# Patient Record
Sex: Female | Born: 1965 | Race: White | Hispanic: No | Marital: Married | Smoking: Never smoker
Health system: Southern US, Community
[De-identification: ages and names within clinical notes are randomized; demographics above are authoritative.]

## PROBLEM LIST (undated history)

## (undated) DIAGNOSIS — T7840XA Allergy, unspecified, initial encounter: Secondary | ICD-10-CM

## (undated) DIAGNOSIS — M1990 Unspecified osteoarthritis, unspecified site: Secondary | ICD-10-CM

## (undated) DIAGNOSIS — E785 Hyperlipidemia, unspecified: Secondary | ICD-10-CM

## (undated) DIAGNOSIS — I10 Essential (primary) hypertension: Secondary | ICD-10-CM

## (undated) DIAGNOSIS — D649 Anemia, unspecified: Secondary | ICD-10-CM

## (undated) DIAGNOSIS — K5900 Constipation, unspecified: Secondary | ICD-10-CM

## (undated) DIAGNOSIS — F419 Anxiety disorder, unspecified: Secondary | ICD-10-CM

## (undated) DIAGNOSIS — K219 Gastro-esophageal reflux disease without esophagitis: Secondary | ICD-10-CM

## (undated) HISTORY — DX: Gastro-esophageal reflux disease without esophagitis: K21.9

## (undated) HISTORY — DX: Constipation, unspecified: K59.00

## (undated) HISTORY — DX: Essential (primary) hypertension: I10

## (undated) HISTORY — DX: Anemia, unspecified: D64.9

## (undated) HISTORY — DX: Unspecified osteoarthritis, unspecified site: M19.90

## (undated) HISTORY — DX: Hyperlipidemia, unspecified: E78.5

## (undated) HISTORY — DX: Allergy, unspecified, initial encounter: T78.40XA

## (undated) HISTORY — DX: Anxiety disorder, unspecified: F41.9

## (undated) HISTORY — PX: BREAST SURGERY: SHX581

## (undated) HISTORY — PX: WISDOM TOOTH EXTRACTION: SHX21

---

## 2002-04-22 ENCOUNTER — Ambulatory Visit (HOSPITAL_BASED_OUTPATIENT_CLINIC_OR_DEPARTMENT_OTHER): Admission: RE | Admit: 2002-04-22 | Discharge: 2002-04-22 | Payer: Self-pay | Admitting: *Deleted

## 2002-04-22 ENCOUNTER — Encounter: Admission: RE | Admit: 2002-04-22 | Discharge: 2002-04-22 | Payer: Self-pay | Admitting: *Deleted

## 2011-02-18 ENCOUNTER — Ambulatory Visit: Payer: BC Managed Care – PPO

## 2011-02-18 ENCOUNTER — Ambulatory Visit (INDEPENDENT_AMBULATORY_CARE_PROVIDER_SITE_OTHER): Payer: BC Managed Care – PPO | Admitting: Emergency Medicine

## 2011-02-18 VITALS — BP 156/83 | HR 73 | Temp 97.5°F | Resp 16 | Ht 66.25 in | Wt 174.8 lb

## 2011-02-18 DIAGNOSIS — R05 Cough: Secondary | ICD-10-CM

## 2011-02-18 DIAGNOSIS — R059 Cough, unspecified: Secondary | ICD-10-CM

## 2011-02-18 DIAGNOSIS — J159 Unspecified bacterial pneumonia: Secondary | ICD-10-CM

## 2011-02-18 DIAGNOSIS — J40 Bronchitis, not specified as acute or chronic: Secondary | ICD-10-CM

## 2011-02-18 DIAGNOSIS — N912 Amenorrhea, unspecified: Secondary | ICD-10-CM

## 2011-02-18 LAB — POCT URINE PREGNANCY: Preg Test, Ur: NEGATIVE

## 2011-02-18 MED ORDER — FLUTICASONE PROPIONATE 50 MCG/ACT NA SUSP: 2.0000 | Freq: Every day | NASAL | Status: DC

## 2011-02-18 MED ORDER — BENZONATATE 200 MG PO CAPS: 200.0000 mg | ORAL_CAPSULE | Freq: Two times a day (BID) | ORAL | Status: AC | PRN

## 2011-02-18 MED ORDER — AMOXICILLIN-POT CLAVULANATE 875-125 MG PO TABS: 1.0000 | ORAL_TABLET | Freq: Two times a day (BID) | ORAL | Status: AC

## 2011-02-18 NOTE — Progress Notes (Deleted)
  Subjective:    Patient ID: Megan Gallagher, female    DOB: 08/01/1965, 46 y.o.   MRN: [id number]  HPI    Review of Systems     Objective:   Physical Exam        Assessment & Plan:

## 2011-02-18 NOTE — Progress Notes (Signed)
CXR impression (per Dr. Cleta Alberts) Minimal increased basilar markings

## 2011-04-16 ENCOUNTER — Other Ambulatory Visit: Payer: Self-pay | Admitting: Emergency Medicine

## 2011-08-06 ENCOUNTER — Other Ambulatory Visit: Payer: Self-pay | Admitting: Physician Assistant

## 2011-08-07 ENCOUNTER — Other Ambulatory Visit: Payer: Self-pay | Admitting: *Deleted

## 2011-09-11 ENCOUNTER — Other Ambulatory Visit: Payer: Self-pay | Admitting: Emergency Medicine

## 2011-09-14 ENCOUNTER — Telehealth: Payer: Self-pay

## 2011-09-14 NOTE — Telephone Encounter (Signed)
The patient called regarding refill of her Lipitor rx.  The patient stated her rx was denied at the pharmacy and she would like a return call at [phone] to discuss why this happened.

## 2011-09-14 NOTE — Telephone Encounter (Signed)
Patient is due for office visit. I called her left message for her to call back so I can determine her follow up plan.

## 2011-09-16 ENCOUNTER — Ambulatory Visit (INDEPENDENT_AMBULATORY_CARE_PROVIDER_SITE_OTHER): Payer: BC Managed Care – PPO | Admitting: Emergency Medicine

## 2011-09-16 VITALS — BP 137/83 | HR 61 | Temp 98.3°F | Resp 16 | Ht 66.78 in | Wt 177.2 lb

## 2011-09-16 DIAGNOSIS — E782 Mixed hyperlipidemia: Secondary | ICD-10-CM

## 2011-09-16 LAB — COMPREHENSIVE METABOLIC PANEL
ALT: 20 U/L (ref 0–35)
AST: 15 U/L (ref 0–37)
Alkaline Phosphatase: 89 U/L (ref 39–117)
BUN: 9 mg/dL (ref 6–23)
Calcium: 9.5 mg/dL (ref 8.4–10.5)
Chloride: 106 mEq/L (ref 96–112)
Creat: 0.61 mg/dL (ref 0.50–1.10)
Total Bilirubin: 0.6 mg/dL (ref 0.3–1.2)

## 2011-09-16 LAB — LIPID PANEL
HDL: 48 mg/dL (ref >39)
Total CHOL/HDL Ratio: 4.3 Ratio
VLDL: 22 mg/dL (ref 0–40)

## 2011-09-16 NOTE — Telephone Encounter (Signed)
Pt will RTC today 

## 2011-09-16 NOTE — Progress Notes (Addendum)
   Date:  09/16/2011   Name:  Megan Gallagher   DOB:  1965-04-07   MRN:  [id number] Gender: female Age: 46 y.o.  PCP:  Lucilla Edin, MD    Chief Complaint: Hyperlipidemia   History of Present Illness:  Megan Gallagher is a 46 y.o. pleasant patient who presents with the following:  Has been on lipitor 40 since September.  has been out of meds for four days and has come in for repeat lab work.  Was instructed to follow up in November but failed to do so.  Denies any problems with meds.  Tolerating well without side effects.  No other complaints currently.  There is no problem list on file for this patient.   No past medical history on file.  No past surgical history on file.  History  Substance Use Topics  . Smoking status: Never Smoker   . Smokeless tobacco: Not on file  . Alcohol Use: Not on file    No family history on file.  No Known Allergies  Medication list has been reviewed and updated.  Outpatient Prescriptions Prior to Visit  Medication Sig Dispense Refill  . amLODipine (NORVASC) 10 MG tablet Take 1 tablet (10 mg total) by mouth daily. NEEDS OFFICE VISIT FOR MORE  30 tablet  0  . atorvastatin (LIPITOR) 40 MG tablet TAKE 1 TABLET BY MOUTH DAILY  30 tablet  0  . atorvastatin (LIPITOR) 10 MG tablet Take 10 mg by mouth daily.      . fluticasone (FLONASE) 50 MCG/ACT nasal spray Place 2 sprays into the nose daily.  16 g  6    Review of Systems:  As per HPI, otherwise negative.    Physical Examination: Filed Vitals:   09/16/11 1101  BP: 137/83  Pulse: 61  Temp: 98.3 F (36.8 C)  Resp: 16   Filed Vitals:   09/16/11 1101  Height: 5' 6.78" (1.696 m)  Weight: 177 lb 3.2 oz (80.377 kg)   Body mass index is 27.94 kg/(m^2). Ideal Body Weight: Weight in (lb) to have BMI = 25: 158.2    GEN: WDWN, NAD, Non-toxic, Alert & Oriented x 3 HEENT: Atraumatic, Normocephalic.  Ears and Nose: No external deformity. EXTR: No clubbing/cyanosis/edema NEURO: Normal  gait.  PSYCH: Normally interactive. Conversant. Not depressed or anxious appearing.  Calm demeanor.    Assessment and Plan: Elevated lipids Labs Refill meds. Follow up in 3 months for recheck on labs  Carmelina Dane, MD I have reviewed and agree with documentation. Robert P. Merla Riches, M.D.

## 2011-09-19 MED ORDER — ROSUVASTATIN CALCIUM 20 MG PO TABS: 20.0000 mg | ORAL_TABLET | Freq: Every day | ORAL | Status: DC

## 2011-09-21 ENCOUNTER — Encounter: Payer: Self-pay | Admitting: Radiology

## 2011-11-11 ENCOUNTER — Telehealth: Payer: Self-pay

## 2011-11-11 NOTE — Telephone Encounter (Signed)
[facility] answering service called stated received call from [facility] pt request prescription be changed from 30 days to 90 days so  Ins. Would pay.  [facility] phone # [phone] pt seen by S. Weber

## 2011-11-12 ENCOUNTER — Telehealth: Payer: Self-pay

## 2011-11-12 MED ORDER — AMLODIPINE BESYLATE 10 MG PO TABS: 10.0000 mg | ORAL_TABLET | Freq: Every day | ORAL | Status: DC

## 2011-11-12 NOTE — Telephone Encounter (Signed)
rx sent in 

## 2011-11-12 NOTE — Telephone Encounter (Signed)
Megan Gallagher from [facility] [address] called for pt needing prescription refill on Norvasc.

## 2012-01-02 ENCOUNTER — Ambulatory Visit (INDEPENDENT_AMBULATORY_CARE_PROVIDER_SITE_OTHER): Payer: BC Managed Care – PPO | Admitting: Family Medicine

## 2012-01-02 ENCOUNTER — Ambulatory Visit: Payer: BC Managed Care – PPO

## 2012-01-02 VITALS — BP 143/83 | HR 91 | Temp 97.6°F | Resp 16 | Ht 67.0 in | Wt 179.0 lb

## 2012-01-02 DIAGNOSIS — R05 Cough: Secondary | ICD-10-CM

## 2012-01-02 DIAGNOSIS — I10 Essential (primary) hypertension: Secondary | ICD-10-CM

## 2012-01-02 DIAGNOSIS — E7800 Pure hypercholesterolemia, unspecified: Secondary | ICD-10-CM | POA: Insufficient documentation

## 2012-01-02 DIAGNOSIS — R509 Fever, unspecified: Secondary | ICD-10-CM

## 2012-01-02 DIAGNOSIS — R059 Cough, unspecified: Secondary | ICD-10-CM

## 2012-01-02 LAB — POCT INFLUENZA A/B
Influenza A, POC: NEGATIVE
Influenza B, POC: NEGATIVE

## 2012-01-02 MED ORDER — HYDROCODONE-HOMATROPINE 5-1.5 MG/5ML PO SYRP: 5.0000 mL | ORAL_SOLUTION | Freq: Three times a day (TID) | ORAL | Status: DC | PRN

## 2012-01-02 MED ORDER — OSELTAMIVIR PHOSPHATE 75 MG PO CAPS: 75.0000 mg | ORAL_CAPSULE | Freq: Two times a day (BID) | ORAL | Status: DC

## 2012-01-02 MED ORDER — CEFDINIR 300 MG PO CAPS: 300.0000 mg | ORAL_CAPSULE | Freq: Two times a day (BID) | ORAL | Status: DC

## 2012-01-02 NOTE — Patient Instructions (Addendum)
You may have the flu or mild pneumonia.  Use the medications as directed and let me know if you are not better in the next few days- Sooner if worse.

## 2012-01-02 NOTE — Progress Notes (Signed)
[facility] [address], [place] [address] [phone]  Date:  01/02/2012   Name:  Megan Gallagher   DOB:  [birth year]   MRN:  [id number]  PCP:  Lucilla Edin, MD    Chief Complaint: Cough, Fever and Wheezing   History of Present Illness:  Megan Gallagher is a 46 y.o. very pleasant female patient who presents with the following:  She is here today with a cough/ sinus and chest mucus/ drainage for about a month.  Then yesterday she got a lot worse with onset of body aches, chills, feeing very tired, fever of 102 last night, and more severe cough.  She has a mild ST, ears feel congested.    No vomiting or diarrhea.    She took mucinex D and DM.  She took some aleve this morning.    She is generally healthy except for htn and high cholesterol  There is no problem list on file for this patient.   Past Medical History  Diagnosis Date  . Hypertension   . Hyperlipidemia     History reviewed. No pertinent past surgical history.  History  Substance Use Topics  . Smoking status: Never Smoker   . Smokeless tobacco: Not on file  . Alcohol Use: Not on file    History reviewed. No pertinent family history.  No Known Allergies  Medication list has been reviewed and updated.  Current Outpatient Prescriptions on File Prior to Visit  Medication Sig Dispense Refill  . amLODipine (NORVASC) 10 MG tablet Take 1 tablet (10 mg total) by mouth daily.  90 tablet  0  . fluticasone (FLONASE) 50 MCG/ACT nasal spray Place 2 sprays into the nose daily.  16 g  6  . rosuvastatin (CRESTOR) 20 MG tablet Take 1 tablet (20 mg total) by mouth daily.  90 tablet  3  . atorvastatin (LIPITOR) 10 MG tablet Take 10 mg by mouth daily.      Marland Kitchen atorvastatin (LIPITOR) 40 MG tablet TAKE 1 TABLET BY MOUTH DAILY  30 tablet  0    Review of Systems:  As per HPI- otherwise negative.   Physical Examination: Filed Vitals:   01/02/12 0852  BP: 143/83  Pulse: 91  Temp: 97.6 F (36.4  C)  Resp: 16   Filed Vitals:   01/02/12 0852  Height: 5\' 7"  (1.702 m)  Weight: 179 lb (81.194 kg)   Body mass index is 28.04 kg/(m^2). Ideal Body Weight: Weight in (lb) to have BMI = 25: 159.3   GEN: WDWN, NAD, Non-toxic, A & O x 3 HEENT: Atraumatic, Normocephalic. Neck supple. No masses, No LAD. Bilateral TM wnl, oropharynx normal.  PEERL,EOMI.   Ears and Nose: No external deformity. CV: RRR, No M/G/R. No JVD. No thrill. No extra heart sounds. PULM: CTA B, no wheezes, crackles, rhonchi. No retractions. No resp. distress. No accessory muscle use. ABD: S, NT, ND EXTR: No c/c/e NEURO Normal gait.  PSYCH: Normally interactive. Conversant. Not depressed or anxious appearing.  Calm demeanor.   Results for orders placed in visit on 01/02/12  POCT INFLUENZA A/B      Component Value Range   Influenza A, POC Negative     Influenza B, POC Negative     [facility] reading (PRIMARY) by  Dr. Patsy Lager. CXR: negative  CHEST - 2 VIEW  Comparison: 02/18/2011  Findings: Heart and mediastinal contours are within normal limits. No focal opacities or effusions. No acute bony abnormality.  IMPRESSION:  No active cardiopulmonary disease   Assessment and Plan: 1. Fever  POCT Influenza A/B, DG Chest 2 View, oseltamivir (TAMIFLU) 75 MG capsule  2. HTN (hypertension)    3. High cholesterol    4. Cough  DG Chest 2 View, cefdinir (OMNICEF) 300 MG capsule, HYDROcodone-homatropine (HYCODAN) 5-1.5 MG/5ML syrup, oseltamivir (TAMIFLU) 75 MG capsule   Suspect that Danisha does have the flu given her symptoms.  Will cover for flu and bronchitis as she has also had a cough for a month.  Tamiflu, omnicef and hycodan as needed.  Let me know if not better- Sooner if worse.   Meds ordered this encounter  Medications  . cefdinir (OMNICEF) 300 MG capsule    Sig: Take 1 capsule (300 mg total) by mouth 2 (two) times daily.    Dispense:  20 capsule    Refill:  0  . HYDROcodone-homatropine (HYCODAN) 5-1.5 MG/5ML syrup     Sig: Take 5 mLs by mouth every 8 (eight) hours as needed for cough.    Dispense:  90 mL    Refill:  0  . oseltamivir (TAMIFLU) 75 MG capsule    Sig: Take 1 capsule (75 mg total) by mouth 2 (two) times daily.    Dispense:  10 capsule    Refill:  0       Jaxxen Voong, MD

## 2012-04-22 ENCOUNTER — Other Ambulatory Visit: Payer: Self-pay | Admitting: Physician Assistant

## 2012-09-14 ENCOUNTER — Other Ambulatory Visit: Payer: Self-pay | Admitting: Family Medicine

## 2012-09-15 ENCOUNTER — Other Ambulatory Visit: Payer: Self-pay | Admitting: Physician Assistant

## 2012-09-15 NOTE — Telephone Encounter (Signed)
Needs OV, labs.  No further refills 

## 2012-10-03 ENCOUNTER — Other Ambulatory Visit: Payer: Self-pay | Admitting: Emergency Medicine

## 2012-11-12 ENCOUNTER — Ambulatory Visit (INDEPENDENT_AMBULATORY_CARE_PROVIDER_SITE_OTHER): Payer: BC Managed Care – PPO | Admitting: Family Medicine

## 2012-11-12 VITALS — BP 140/90 | HR 74 | Temp 98.1°F | Resp 16 | Ht 66.0 in | Wt 182.0 lb

## 2012-11-12 DIAGNOSIS — E7800 Pure hypercholesterolemia, unspecified: Secondary | ICD-10-CM

## 2012-11-12 DIAGNOSIS — I10 Essential (primary) hypertension: Secondary | ICD-10-CM

## 2012-11-12 DIAGNOSIS — R058 Other specified cough: Secondary | ICD-10-CM

## 2012-11-12 DIAGNOSIS — R059 Cough, unspecified: Secondary | ICD-10-CM

## 2012-11-12 DIAGNOSIS — R0982 Postnasal drip: Secondary | ICD-10-CM

## 2012-11-12 DIAGNOSIS — R05 Cough: Secondary | ICD-10-CM

## 2012-11-12 DIAGNOSIS — Z79899 Other long term (current) drug therapy: Secondary | ICD-10-CM

## 2012-11-12 DIAGNOSIS — G4700 Insomnia, unspecified: Secondary | ICD-10-CM

## 2012-11-12 LAB — POCT CBC
Lymph, poc: 2.4 (ref 0.6–3.4)
MCH, POC: 29.5 pg (ref 27–31.2)
MCHC: 31.2 g/dL — AB (ref 31.8–35.4)
MID (cbc): 0.4 (ref 0–0.9)
MPV: 8.6 fL (ref 0–99.8)
POC Granulocyte: 4.2 (ref 2–6.9)
POC LYMPH PERCENT: 34.1 %L (ref 10–50)
POC MID %: 6.2 %M (ref 0–12)
RDW, POC: 13.9 %
WBC: 7 10*3/uL (ref 4.6–10.2)

## 2012-11-12 LAB — LIPID PANEL
HDL: 54 mg/dL (ref >39)
LDL Cholesterol: 106 mg/dL — ABNORMAL HIGH (ref 0–99)

## 2012-11-12 LAB — COMPREHENSIVE METABOLIC PANEL
AST: 15 U/L (ref 0–37)
BUN: 7 mg/dL (ref 6–23)
CO2: 26 mEq/L (ref 19–32)
Calcium: 9.7 mg/dL (ref 8.4–10.5)
Creat: 0.68 mg/dL (ref 0.50–1.10)
Potassium: 4.2 mEq/L (ref 3.5–5.3)
Sodium: 142 mEq/L (ref 135–145)

## 2012-11-12 LAB — TSH: TSH: 2.17 u[IU]/mL (ref 0.350–4.500)

## 2012-11-12 MED ORDER — RANITIDINE HCL 150 MG PO TABS: 150.0000 mg | ORAL_TABLET | Freq: Every day | ORAL | Status: DC

## 2012-11-12 MED ORDER — ROSUVASTATIN CALCIUM 20 MG PO TABS: 20.0000 mg | ORAL_TABLET | Freq: Every day | ORAL | Status: DC

## 2012-11-12 MED ORDER — AMLODIPINE BESYLATE 10 MG PO TABS: 10.0000 mg | ORAL_TABLET | Freq: Every day | ORAL | Status: DC

## 2012-11-12 MED ORDER — FLUTICASONE PROPIONATE 50 MCG/ACT NA SUSP: 2.0000 | Freq: Every day | NASAL | Status: DC

## 2012-11-12 MED ORDER — CETIRIZINE HCL 10 MG PO TABS: 10.0000 mg | ORAL_TABLET | Freq: Every day | ORAL | Status: DC

## 2012-11-12 MED ORDER — ZOLPIDEM TARTRATE 5 MG PO TABS: 5.0000 mg | ORAL_TABLET | Freq: Every evening | ORAL | Status: DC | PRN

## 2012-11-12 NOTE — Progress Notes (Addendum)
This chart was scribed for Megan Sorenson, MD at [facility] by Yevette Edwards, Scribe. This pt's care was started at 12:46 PM.  Subjective:    Patient ID: Megan Gallagher, female    DOB: 1965-08-30, 46 y.o.   MRN: [id number]  Chief Complaint  Patient presents with  . Medication Refill    Norvasc, Crestor  . Cough    x 3 months    Cough Associated symptoms include postnasal drip, rhinorrhea, shortness of breath and wheezing. Pertinent negatives include no chest pain, headaches or sore throat. Her past medical history is significant for environmental allergies.    Megan SCHLAUCH is a 47 y.o. female who presents to [facility] complaining of a cough which has persisted for three months. The pt reports the cough is productive of a cream-colored phlegm, and she reports the cough increases at night and has interrupted her sleep. As associated symptoms, the pt has also experienced intermittent SOB which is triggered with activity, increased wheezing at night, rhinorrhea, post-nasal drip, mild congestion, intermittent voice changes, and a foul/metalic taste in her mouth upon awakening.The pt denies experiencing a headache, chest pain, chest tightness, otalgia, sore throat, or heart burn. She has seasonal allergies. She denies h/o asthma. She also denies cigarette smoking or passive smoke exposure. The pt states her children are smoke outside of the house.   Past Medical History  Diagnosis Date  . Hypertension   . Hyperlipidemia    No current outpatient prescriptions on file prior to visit.   No current facility-administered medications on file prior to visit.   No Known Allergies  Review of Systems  HENT: Positive for congestion, postnasal drip, rhinorrhea and voice change. Negative for sore throat.   Eyes: Positive for photophobia.  Respiratory: Positive for cough, shortness of breath and wheezing. Negative for chest tightness.   Cardiovascular: Negative for chest pain.   Allergic/Immunologic: Positive for environmental allergies.  Neurological: Negative for headaches.  Psychiatric/Behavioral: Positive for sleep disturbance.      BP 140/90  Pulse 74  Temp(Src) 98.1 F (36.7 C) (Oral)  Resp 16  Ht 5\' 6"  (1.676 m)  Wt 182 lb (82.555 kg)  BMI 29.39 kg/m2  SpO2 97%  PF 300 L/min  LMP 06/05/2011 Objective:   Physical Exam  Nursing note and vitals reviewed. Constitutional: She is oriented to person, place, and time. She appears well-developed and well-nourished. No distress.  HENT:  Head: Normocephalic and atraumatic.  Nose: Mucosal edema and rhinorrhea present.  Mouth/Throat: Uvula is midline. Posterior oropharyngeal erythema present.  White streaking from post-nasal drip visualized.   Cerumen bilaterally preventing TM from being visualized.   Eyes: EOM are normal.  Neck: Neck supple. No tracheal deviation present. No thyromegaly present.  Cardiovascular: Normal rate, regular rhythm and normal heart sounds.   No murmur heard. Pulmonary/Chest: Effort normal and breath sounds normal. No respiratory distress. She has no wheezes. She has no rales.  Musculoskeletal: Normal range of motion.  Lymphadenopathy:       Head (right side): No submental, no submandibular, no tonsillar, no preauricular and no posterior auricular adenopathy present.       Head (left side): No submental, no submandibular, no tonsillar, no preauricular and no posterior auricular adenopathy present.    She has no cervical adenopathy.  Neurological: She is alert and oriented to person, place, and time.  Skin: Skin is warm and dry.  Psychiatric: She has a normal mood and affect. Her behavior is normal.   The  pt's predicted peak flow is 472, but her measured peak flow was 277.  After alb neb, peak flow only improved to 300.     Assessment & Plan:   HTN (hypertension) - Plan: POCT CBC, TSH, Lipid panel, Comprehensive metabolic panel  High cholesterol - Plan: POCT CBC, TSH, Lipid  panel, Comprehensive metabolic panel  Postnasal drip - Plan: POCT CBC, TSH, Lipid panel, Comprehensive metabolic panel  Cough - Plan: POCT CBC, TSH, Lipid panel, Comprehensive metabolic panel, fluticasone (FLONASE) 50 MCG/ACT nasal spray  Encounter for long-term (current) use of other medications - Plan: POCT CBC, TSH, Lipid panel, Comprehensive metabolic panel  Upper airway cough syndrome - start qhs h1 and h2 blocker and was as qhs nasal steroid.  If needed, try otc delsym.  I think her cough is most likely due to post-nasal drip but may be exacerbated by silent laryngeal reflux.  However, suprising that peak flow was so low today so may need CXR and spirometry if sxs persist.  Insomnia - gave short term ambien for pt to try as she is very distressed about her lack of sleep and requests med. Hopefully this issue will resolve itself after cough is treated.  Meds ordered this encounter  Medications  . cetirizine (ZYRTEC) 10 MG tablet    Sig: Take 1 tablet (10 mg total) by mouth at bedtime.    Dispense:  30 tablet    Refill:  11  . ranitidine (ZANTAC) 150 MG tablet    Sig: Take 1 tablet (150 mg total) by mouth at bedtime.    Dispense:  30 tablet    Refill:  11  . fluticasone (FLONASE) 50 MCG/ACT nasal spray    Sig: Place 2 sprays into both nostrils at bedtime.    Dispense:  16 g    Refill:  6  . zolpidem (AMBIEN) 5 MG tablet    Sig: Take 1 tablet (5 mg total) by mouth at bedtime as needed for sleep.    Dispense:  10 tablet    Refill:  0  . amLODipine (NORVASC) 10 MG tablet    Sig: Take 1 tablet (10 mg total) by mouth daily.    Dispense:  90 tablet    Refill:  3  . rosuvastatin (CRESTOR) 20 MG tablet    Sig: Take 1 tablet (20 mg total) by mouth daily.    Dispense:  90 tablet    Refill:  1    I personally performed the services described in this documentation, which was scribed in my presence. The recorded information has been reviewed and considered, and addended by me as  needed.  Megan Sorenson, MD MPH

## 2012-11-12 NOTE — Patient Instructions (Signed)
Start taking the above medications every night for the cough. I want you to continue on them for at least a month.  In about a month, while you are still on the medication, please come back to see me and we will recheck your lungs. If your breathing test is still poor at that time, we will do more extensive lung testing but hopefully by opening up your upper airway with below medication it will help improve your breathing substantially. I think that your cough is mainly being caused by post-nasal drip and all the medications you have been prescribed are aimed to reduce the post-nasal drip which causes "upper airway cough syndrome." GERD (REFLUX) is an extremely common cause of respiratory symptoms, many times with no significant heartburn at all.  It can be treated with medication, but also with lifestyle changes including avoidance of late meals, excessive alcohol, smoking cessation, and avoid fatty foods, chocolate, peppermint, colas, red wine, and acidic juices such as orange juice.  NO MINT OR MENTHOL PRODUCTS SO NO COUGH DROPS  USE SUGARLESS CANDY INSTEAD (jolley ranchers or Stover's)  NO OIL BASED VITAMINS - use powdered substitutes.  In the meantime, if you cannot sleep due to cough, when it is REALLY severe, you may take delsym 2 tsp every 12 hours as needed -  the strongest over the counter cough syrup made.

## 2012-11-20 ENCOUNTER — Encounter: Payer: Self-pay | Admitting: Family Medicine

## 2013-01-11 ENCOUNTER — Other Ambulatory Visit: Payer: Self-pay | Admitting: Family Medicine

## 2013-01-13 NOTE — Telephone Encounter (Signed)
Pt last seen November. Please advise refill.

## 2013-01-13 NOTE — Telephone Encounter (Signed)
rx printed. Needs f/u [facility] for additional refills.

## 2013-01-13 NOTE — Telephone Encounter (Signed)
Rx faxed to pharm

## 2013-06-18 ENCOUNTER — Other Ambulatory Visit: Payer: Self-pay | Admitting: Family Medicine

## 2013-06-19 ENCOUNTER — Other Ambulatory Visit: Payer: Self-pay | Admitting: Family Medicine

## 2013-08-21 ENCOUNTER — Other Ambulatory Visit: Payer: Self-pay | Admitting: Family Medicine

## 2013-09-06 ENCOUNTER — Other Ambulatory Visit: Payer: Self-pay | Admitting: Family Medicine

## 2013-10-13 ENCOUNTER — Other Ambulatory Visit: Payer: Self-pay | Admitting: Emergency Medicine

## 2013-10-16 ENCOUNTER — Telehealth: Payer: Self-pay

## 2013-10-16 ENCOUNTER — Other Ambulatory Visit: Payer: Self-pay | Admitting: Physician Assistant

## 2013-10-16 MED ORDER — ROSUVASTATIN CALCIUM 20 MG PO TABS: 20.0000 mg | ORAL_TABLET | Freq: Every day | ORAL | Status: DC

## 2013-10-16 NOTE — Telephone Encounter (Signed)
Pt called to say that she had to have a 30 day supply of her Crestor in order for insurance to cover it. Advised that I would do this for her, but that she had to RTC before she ran out. Pt agreeable.

## 2013-10-17 ENCOUNTER — Telehealth: Payer: Self-pay

## 2013-10-17 MED ORDER — ROSUVASTATIN CALCIUM 20 MG PO TABS: 20.0000 mg | ORAL_TABLET | Freq: Every day | ORAL | Status: DC

## 2013-11-19 ENCOUNTER — Other Ambulatory Visit: Payer: Self-pay | Admitting: Family Medicine

## 2013-11-20 ENCOUNTER — Ambulatory Visit (INDEPENDENT_AMBULATORY_CARE_PROVIDER_SITE_OTHER): Payer: BC Managed Care – PPO | Admitting: Physician Assistant

## 2013-11-20 VITALS — BP 150/82 | HR 87 | Temp 97.8°F | Resp 16 | Ht 66.5 in | Wt 187.0 lb

## 2013-11-20 DIAGNOSIS — M62830 Muscle spasm of back: Secondary | ICD-10-CM

## 2013-11-20 DIAGNOSIS — E7800 Pure hypercholesterolemia, unspecified: Secondary | ICD-10-CM

## 2013-11-20 DIAGNOSIS — G4700 Insomnia, unspecified: Secondary | ICD-10-CM | POA: Insufficient documentation

## 2013-11-20 DIAGNOSIS — R0982 Postnasal drip: Secondary | ICD-10-CM

## 2013-11-20 DIAGNOSIS — I10 Essential (primary) hypertension: Secondary | ICD-10-CM

## 2013-11-20 DIAGNOSIS — F411 Generalized anxiety disorder: Secondary | ICD-10-CM

## 2013-11-20 DIAGNOSIS — R059 Cough, unspecified: Secondary | ICD-10-CM

## 2013-11-20 DIAGNOSIS — R05 Cough: Secondary | ICD-10-CM

## 2013-11-20 LAB — COMPREHENSIVE METABOLIC PANEL
ALT: 22 U/L (ref 0–35)
AST: 18 U/L (ref 0–37)
Albumin: 5 g/dL (ref 3.5–5.2)
Alkaline Phosphatase: 86 U/L (ref 39–117)
BILIRUBIN TOTAL: 0.5 mg/dL (ref 0.2–1.2)
BUN: 8 mg/dL (ref 6–23)
CO2: 22 meq/L (ref 19–32)
CREATININE: 0.57 mg/dL (ref 0.50–1.10)
Calcium: 10 mg/dL (ref 8.4–10.5)
Chloride: 104 mEq/L (ref 96–112)
Glucose, Bld: 102 mg/dL — ABNORMAL HIGH (ref 70–99)
Potassium: 3.6 mEq/L (ref 3.5–5.3)
SODIUM: 140 meq/L (ref 135–145)
TOTAL PROTEIN: 7.7 g/dL (ref 6.0–8.3)

## 2013-11-20 LAB — LIPID PANEL
Cholesterol: 164 mg/dL (ref 0–200)
HDL: 51 mg/dL (ref >39)
LDL Cholesterol: 93 mg/dL (ref 0–99)
TRIGLYCERIDES: 100 mg/dL (ref <150)
Total CHOL/HDL Ratio: 3.2 Ratio
VLDL: 20 mg/dL (ref 0–40)

## 2013-11-20 LAB — TSH: TSH: 2.038 u[IU]/mL (ref 0.350–4.500)

## 2013-11-20 MED ORDER — HYDROCOD POLST-CHLORPHEN POLST 10-8 MG/5ML PO LQCR: 5.0000 mL | Freq: Two times a day (BID) | ORAL | Status: DC | PRN

## 2013-11-20 MED ORDER — TRAZODONE HCL 50 MG PO TABS: 25.0000 mg | ORAL_TABLET | Freq: Every evening | ORAL | Status: DC | PRN

## 2013-11-20 MED ORDER — OMEPRAZOLE 40 MG PO CPDR: 40.0000 mg | DELAYED_RELEASE_CAPSULE | Freq: Every day | ORAL | Status: DC

## 2013-11-20 MED ORDER — ROSUVASTATIN CALCIUM 20 MG PO TABS: 20.0000 mg | ORAL_TABLET | Freq: Every day | ORAL | Status: DC

## 2013-11-20 MED ORDER — PAROXETINE HCL 20 MG PO TABS: 20.0000 mg | ORAL_TABLET | Freq: Every day | ORAL | Status: DC

## 2013-11-20 NOTE — Patient Instructions (Signed)
We covered a lot today!  1. You've got a lot going on right away. You're doing a great job hanging in there. 2. For your anxiety..started paxil today. Please take 20mg  every morning. Please keep in mind this medication may cause you to gain some weight and may have some sexual side effects.  3. For your insomnia....started trazodone today. This is an antidepressant which really helps with sleep. Please take 25mg , which is 1/2 pill, every night for sleep.  4. For your cough... started tussionex today. Please take as needed as prescribed. I also think your reflux is causing the cough as well. Please take the Prilosec 40 mg every morning. 5. We drew labs today, I'll contact you with the results.  6. I would like to see you back in 3 weeks. We will further discuss your neck pain, your blood pressure, and see how these medications are doing at that visit.

## 2013-11-20 NOTE — Progress Notes (Signed)
Subjective:    Patient ID: Megan Gallagher, female    DOB: [birth year], 48 y.o.   MRN: [id number]  [name], MD  Chief Complaint  Patient presents with  . Anxiety  . med refill    Crestor  . medical assessment    per insurance (no paperwork)   Patient Active Problem List   Diagnosis Date Noted  . Insomnia 11/20/2013  . HTN (hypertension) 01/02/2012  . High cholesterol 01/02/2012   Prior to Admission medications   Medication Sig Start Date End Date Taking? Authorizing Provider  amLODipine (NORVASC) 10 MG tablet Take 1 tablet (10 mg total) by mouth daily. 11/12/12  Yes Shawnee Knapp, MD  cetirizine (ZYRTEC) 10 MG tablet TAKE 1 TABLET BY MOUTH AT BEDTIME 11/19/13  Yes Chelle S Jeffery, PA-C  fluticasone (FLONASE) 50 MCG/ACT nasal spray PLACE 2 SPRAYS INTO BOTH NOSTRILS AT BEDTIME   Yes Shawnee Knapp, MD  ranitidine (ZANTAC) 150 MG tablet TAKE 1 TABLET BY MOUTH EVERY NIGHT AT BEDTIME 11/19/13  Yes Chelle S Jeffery, PA-C  rosuvastatin (CRESTOR) 20 MG tablet Take 1 tablet (20 mg total) by mouth daily. 10/17/13  Yes Elby Beck, FNP  zolpidem (AMBIEN) 5 MG tablet Take 1 tablet (5 mg total) by mouth at bedtime as needed for sleep. 01/13/13  Yes Shawnee Knapp, MD   Medications, allergies, past medical history, surgical history, family history, social history and problem list reviewed and updated.  HPI  57 yof with PMH HTN, hyperlipidemia, allergies with post-nasal drip, and insomnia presents today for medication refill.   We last saw her one year ago. At that visit her norvasc and crestor were refilled. She was started on ambien prn for her insomnia. She also had a chronic cough at that time which was attributed to post-nasal drip and she was started on zyrtec/zantac/flonase.   Refills - Today she states she needs refill of her crestor.   Cough - She states that the cough resolved for the majority of the past year with the allergy tx. Unfortunately the cough returned about 2 months ago.  She went to a diff uc at that time and was prescribed cough medicine and azithro. This helped for a few days but the cough returned and is still present today. She denies any fever, chills, ear pain, sore throat, congestion, or abd pain. The cough is worst at night and she feels as if it is caused by a tickle in her throat. She has a hx of gerd but has only been treated with the zantac for the past year. She has had indigestion after meals for past few months. She denies any unintentional wt loss or night sweats.   Insomnia - Present for past year or so. Has trouble both falling asleep and staying asleep. She was started on [place] last year which she uses nightly and does not help much.   Anxiety - She thinks she has always been an anxious person. Diag with depression 10 yrs ago after mom passed. On zoloft for 6 months at that time but stopped it as she thought it made her feel slow. She bought a new home 10 months ago. At that time her son, daughter in law, and grandson moved back in with her and husband. She thinks this is her main stressor but is also anxious with work and the fact that her other son got evicted and may be moving in with her as well. She is anxious most days  of the week. Feels it is affecting her work because she can't concentrate. THis has been ongoing for approx 10 months. No fam hx mental illness. She has a supportive husband whom she can speak to about these things.   HTN - On norvasc, takes as prescribed. Doesn't check BP at home. States that when she went to diff uc last month it was elevated in the 160s. Today she is 150/82 in clinic.   Review of Systems No CP, SOB, fever, chills, diarrhea, N/V, constipation.     Objective:   Physical Exam  Constitutional: She is oriented to person, place, and time. She appears well-developed and well-nourished.  Non-toxic appearance. She does not have a sickly appearance. She does not appear ill. No distress.  BP 150/82 mmHg  Pulse 87   Temp(Src) 97.8 F (36.6 C) (Oral)  Resp 16  Ht 5' 6.5" (1.689 m)  Wt 187 lb (84.823 kg)  BMI 29.73 kg/m2  SpO2 98%  LMP 06/05/2011   Eyes: Conjunctivae and EOM are normal. Pupils are equal, round, and reactive to light.  Cardiovascular: Normal rate, regular rhythm, S1 normal, S2 normal and normal heart sounds.  PMI is not displaced.  Exam reveals no gallop.   No murmur heard. Pulmonary/Chest: Effort normal and breath sounds normal. No tachypnea. She has no decreased breath sounds. She has no wheezes. She has no rhonchi. She has no rales.  Musculoskeletal:       Thoracic back: She exhibits spasm.       Right lower leg: She exhibits edema.       Left lower leg: She exhibits edema.  Mild pitting pretibial edema over anterior legs just superior to ankle. No erythema. No skin lesion.   Muscle spasm noted along medial right scapular border. TTP. No erythema.   Neurological: She is alert and oriented to person, place, and time.  Psychiatric: Her speech is normal. Judgment and thought content normal. Her mood appears anxious. Her affect is not angry. Cognition and memory are normal. She does not exhibit a depressed mood.  Fidgety seated on bed.       Assessment & Plan:   28 yof with PMH HTN, hyperlipidemia, allergies with post-nasal drip, and insomnia presents today for medication refill. She mentions anxiety today in clinic.   Insomnia - Plan: TSH, traZODone (DESYREL) 50 MG tablet --Trazodone 25 mg to start, pt instructed she can slowly titrate up to 50mg  prior to our revisit on 12/9 --Could be due to anxiety along with irritating cough, hopefully controlling all these factors will help --Option of short term xanax if trazodone not working when we see her back  Cough - Plan: CBC, chlorpheniramine-HYDROcodone (TUSSIONEX PENNKINETIC ER) 10-8 MG/5ML LQCR Postnasal drip - Plan: omeprazole (PRILOSEC) 40 MG capsule --Cough has been relatively well controlled with zyrtec/zantac/flonase --Most  likely secondary to gerd as the cough is worst at night, she feels irritation in back of her throat, and she has indigestion after meals --Not prod, no fever/chills, exam wnl today --prilosec trial, elevate head bed, no meals 3 hrs prior to bed --tussionex bid for 3-4 days to help cough resolve and help with sleep  Essential hypertension - Plan: Comprehensive metabolic panel --hypertensive today and at diff clinic recently --encouraged to check at home  --will further discuss when we see her in clinic on 12/9. Possible switch to diff class as she also has some bilateral LE edema.  -cmp today  High cholesterol - Plan: Lipid panel, rosuvastatin (CRESTOR) 20 MG  tablet --lipids today   Generalized anxiety disorder - Plan: TSH, PARoxetine (PAXIL) 20 MG tablet --excessive worry past 9-10 months affecting her life --not interested in counseling at this time --Paxil 20mg  qd - we will see her back in clinic on 12/9 and will discuss titration up at that point  --Discussed poss wt gain and sexual se with pt --tsh today  Muscle spasm of back --Small spasm along medial scapular border --Will see if it resolves with improved rest, massage, applying heat, and improved posture while driving, at work --If still present at next visit will discuss poss muscle relaxant, did not start today as starting several other medications already  Julieta Gutting, PA-C Physician Assistant-Certified [facility]  11/20/2013 4:04 PM  Over 45 minutes spent with patient today, greater than 50% spent discussing anxiety and insomnia conditions.

## 2013-11-21 LAB — CBC
HCT: 45.2 % (ref 36.0–46.0)
Hemoglobin: 15.4 g/dL — ABNORMAL HIGH (ref 12.0–15.0)
MCH: 29 pg (ref 26.0–34.0)
MCHC: 34.1 g/dL (ref 30.0–36.0)
MCV: 85.1 fL (ref 78.0–100.0)
MPV: 9.9 fL (ref 9.4–12.4)
PLATELETS: 280 10*3/uL (ref 150–400)
RBC: 5.31 MIL/uL — ABNORMAL HIGH (ref 3.87–5.11)
RDW: 14.1 % (ref 11.5–15.5)
WBC: 9.9 10*3/uL (ref 4.0–10.5)

## 2013-11-21 NOTE — Progress Notes (Signed)
I was directly involved with the patient's care and agree with the diagnosis and treatment plan.

## 2013-11-24 ENCOUNTER — Encounter: Payer: Self-pay | Admitting: Radiology

## 2013-11-24 NOTE — Progress Notes (Signed)
Appointment made per Lorenz Coaster note to [facility].  Patient was added to Dr Thompson Caul schedule on 12/9 @ 3:30 pm.

## 2013-11-30 ENCOUNTER — Other Ambulatory Visit: Payer: Self-pay | Admitting: Family Medicine

## 2013-12-01 ENCOUNTER — Other Ambulatory Visit: Payer: Self-pay | Admitting: Physician Assistant

## 2013-12-10 ENCOUNTER — Ambulatory Visit (INDEPENDENT_AMBULATORY_CARE_PROVIDER_SITE_OTHER): Payer: BC Managed Care – PPO | Admitting: Physician Assistant

## 2013-12-10 ENCOUNTER — Encounter: Payer: Self-pay | Admitting: Family Medicine

## 2013-12-10 VITALS — BP 148/85 | HR 78 | Temp 97.4°F | Resp 16 | Ht 67.5 in | Wt 183.0 lb

## 2013-12-10 DIAGNOSIS — M62830 Muscle spasm of back: Secondary | ICD-10-CM

## 2013-12-10 DIAGNOSIS — I10 Essential (primary) hypertension: Secondary | ICD-10-CM

## 2013-12-10 DIAGNOSIS — G4700 Insomnia, unspecified: Secondary | ICD-10-CM

## 2013-12-10 DIAGNOSIS — R058 Other specified cough: Secondary | ICD-10-CM

## 2013-12-10 DIAGNOSIS — R05 Cough: Secondary | ICD-10-CM

## 2013-12-10 DIAGNOSIS — F411 Generalized anxiety disorder: Secondary | ICD-10-CM

## 2013-12-10 MED ORDER — CYCLOBENZAPRINE HCL 10 MG PO TABS: 10.0000 mg | ORAL_TABLET | Freq: Three times a day (TID) | ORAL | Status: DC | PRN

## 2013-12-10 MED ORDER — HYDROCHLOROTHIAZIDE 25 MG PO TABS: 12.5000 mg | ORAL_TABLET | Freq: Every day | ORAL | Status: DC

## 2013-12-10 MED ORDER — OMEPRAZOLE 20 MG PO CPDR: 20.0000 mg | DELAYED_RELEASE_CAPSULE | Freq: Every day | ORAL | Status: DC

## 2013-12-10 NOTE — Patient Instructions (Addendum)
I'm very happy you're doing better today! Please continue to use the trazodone at night as needed for insomnia. It is smart to continue to only use it when you feel you need it and not nightly if you don't need to.  Please stay on the paxil 20 mg every day for the anxiety. As we talked about we can go up to 30mg  of this if you feel that is needed. Your cholesterol is great! Keep up the good work with the diet.  Please take the flexeril every 8 hours as needed for the spasm in your back. I've sent in 30 pills for this. Also, heat and ice will help as well as massage. As your blood pressure is still not well controlled we've stopped the amlodipine and started hctz 12.5 mg daily. Please take your blood pressure 2-3 times per week with your home cuff and record the findings. If you continue to run 140/90 or higher we will look at increasing the dose. Remember, if you can a 30 minute walk every day will work wonders for your blood pressure, your insomnia, and your anxiety.  In 4-6 weeks please stop taking the 40 mg prilosec and start with the 20 mg prilosec. We would like to slowly get off the med if you can.  We'd like to see you back in 2 months. Please call the clinic at 104 to see my schedule when you're free and come in to see me. Thank you!

## 2013-12-10 NOTE — Progress Notes (Signed)
Subjective:    Patient ID: Megan Gallagher, female    DOB: [birth year], 48 y.o.   MRN: [id number]  PCP: [name], MD  Chief Complaint  Patient presents with  . Medication check   Patient Active Problem List   Diagnosis Date Noted  . Insomnia 11/20/2013  . HTN (hypertension) 01/02/2012  . High cholesterol 01/02/2012   Prior to Admission medications   Medication Sig Start Date End Date Taking? Authorizing Provider  cetirizine (ZYRTEC) 10 MG tablet TAKE 1 TABLET BY MOUTH AT BEDTIME 11/19/13  Yes Chelle S Jeffery, PA-C  fluticasone (FLONASE) 50 MCG/ACT nasal spray PLACE 2 SPRAYS INTO BOTH NOSTRILS AT BEDTIME   Yes Shawnee Knapp, MD  omeprazole (PRILOSEC) 40 MG capsule Take 1 capsule (40 mg total) by mouth daily. 11/20/13  Yes Mahlik Lenn, PA  PARoxetine (PAXIL) 20 MG tablet Take 1 tablet (20 mg total) by mouth daily. 11/20/13  Yes Damarius Karnes, PA  rosuvastatin (CRESTOR) 20 MG tablet Take 1 tablet (20 mg total) by mouth daily. 11/20/13  Yes Araceli Bouche, PA  traZODone (DESYREL) 50 MG tablet Take 0.5-1 tablets (25-50 mg total) by mouth at bedtime as needed for sleep. 11/20/13  Yes Araceli Bouche, PA  cyclobenzaprine (FLEXERIL) 10 MG tablet Take 1 tablet (10 mg total) by mouth 3 (three) times daily as needed for muscle spasms. 12/10/13   Araceli Bouche, PA  hydrochlorothiazide (HYDRODIURIL) 25 MG tablet Take 0.5 tablets (12.5 mg total) by mouth daily. 12/10/13   Araceli Bouche, PA  omeprazole (PRILOSEC) 20 MG capsule Take 1 capsule (20 mg total) by mouth daily. 12/10/13   Araceli Bouche, PA   Medications, allergies, past medical history, surgical history, family history, social history and problem list reviewed and updated.  HPI  49 yof with PMH htn, insomnia, anxiety, high cholesterol, and gerd returns to clinic for follow up.   Insomnia - when we saw her 3 wks ago she mentioned Lorrin Mais was not helping at all. She was having trouble nightly both falling asleep and staying asleep. Started trazodone  at that time. Pt initially started 25mg  dose but quickly went up to the 50mg  dose. She feels the medication has worked very well. She is only taking approx once per week when she feels she really needs it.   GAD - Started paxil 20 qd last visit. Was having anxiety affecting her more days than not, affecting both her work and home life. Also having assoc insomnia. She feels she is much improved after starting the paxil. She has stayed at the low dose 20mg . Thinks she has mellowed at home, not having any outbursts, and not having panic moments. At last visit we discussed the option of xanax prn in the future, but she feels happy on this now.   HTN - Has a cuff at home. Has checked maybe twice since our last visit. Has been running 150/80s. Elevated in clinic today. She is taking norvasc 10mg  daily as prescribed. She had some LE edema at last visit, this has eased up a bit but she still thinks mildly present. Denies CP, SOB, HA. She is eating well at home and down 4# from last visit.   GERD/cough - Was having epigastric burning with meals and cough mostly at night last visit. Stopped zantac and started on prilosec 40. Sx have greatly improved. Cough is mostly resolved and dyspepsia resolved. Still using flonase and zyrtec daily.   Back pain - continues to have pain along right scapular border.  Pain is constant and described as dull but occasionally described as shooting with certain movements.    Review of Systems No CP, SOB, fever, chills.     Objective:   Physical Exam  Constitutional: She is oriented to person, place, and time. She appears well-developed and well-nourished.  Non-toxic appearance. She does not have a sickly appearance. She does not appear ill. No distress.  BP 148/85 mmHg  Pulse 78  Temp(Src) 97.4 F (36.3 C) (Oral)  Resp 16  Ht 5' 7.5" (1.715 m)  Wt 183 lb (83.008 kg)  BMI 28.22 kg/m2  SpO2 95%  LMP 06/05/2011   Eyes: Conjunctivae and EOM are normal. Pupils are equal,  round, and reactive to light.  Cardiovascular: Normal rate, regular rhythm, S1 normal, S2 normal and normal heart sounds.  Exam reveals no gallop.   No murmur heard. Mild nonpittiing edema LE bilaterally to ankles.   Pulmonary/Chest: Effort normal and breath sounds normal. She has no decreased breath sounds. She has no wheezes. She has no rhonchi. She has no rales.  Musculoskeletal:       Arms: TTP right medial sternal border. Spasm noted in area. No erythema. Full ROM at shoulder.   Neurological: She is alert and oriented to person, place, and time.  Skin: Skin is warm and dry. No rash noted.  Psychiatric: She has a normal mood and affect. Her speech is normal.      Assessment & Plan:   3 yof with PMH htn, insomnia, anxiety, high cholesterol, and gerd returns to clinic for follow up.   Muscle spasm of back - Plan: cyclobenzaprine (FLEXERIL) 10 MG tablet --flexeril --rest, rom, heat/ice, massage  Essential hypertension - Plan: hydrochlorothiazide (HYDRODIURIL) 25 MG tablet --148/85 today, 150/80s at home recently --pt with continued mild LE edema --switch from amlodipine to hctz 12.5 --check bp 2-3x/week home, document, rtc 2 months with readings, titrate dose accordingly --continue healthier diet, try to get 30 min aerobic exercise most days of week  Upper airway cough syndrome - Plan: omeprazole (PRILOSEC) 20 MG capsule --Mostly resolved with prilosec 40mg  --continue 40mg  dose for 5 more wks, then transition down to 20mg  dose, rtc 3 wks after transition down to see if sx still well controlled  Generalized anxiety disorder --Much improved with paxil 20mg  --continue for now --rtc 2 months  Insomnia --much improved with trazodone prn and paxil qd --continue with these, no xanax needed at this time  Julieta Gutting, PA-C Physician Assistant-Certified [facility]  12/10/2013 6:55 PM

## 2013-12-20 ENCOUNTER — Other Ambulatory Visit: Payer: Self-pay | Admitting: Physician Assistant

## 2013-12-26 IMAGING — CR DG CHEST 2V
2 series · 2 of 2 positions shown · non-contrast
Comparison: 02/18/2011

CLINICAL DATA: Cough, fever.

CHEST - 2 VIEW

[PA]
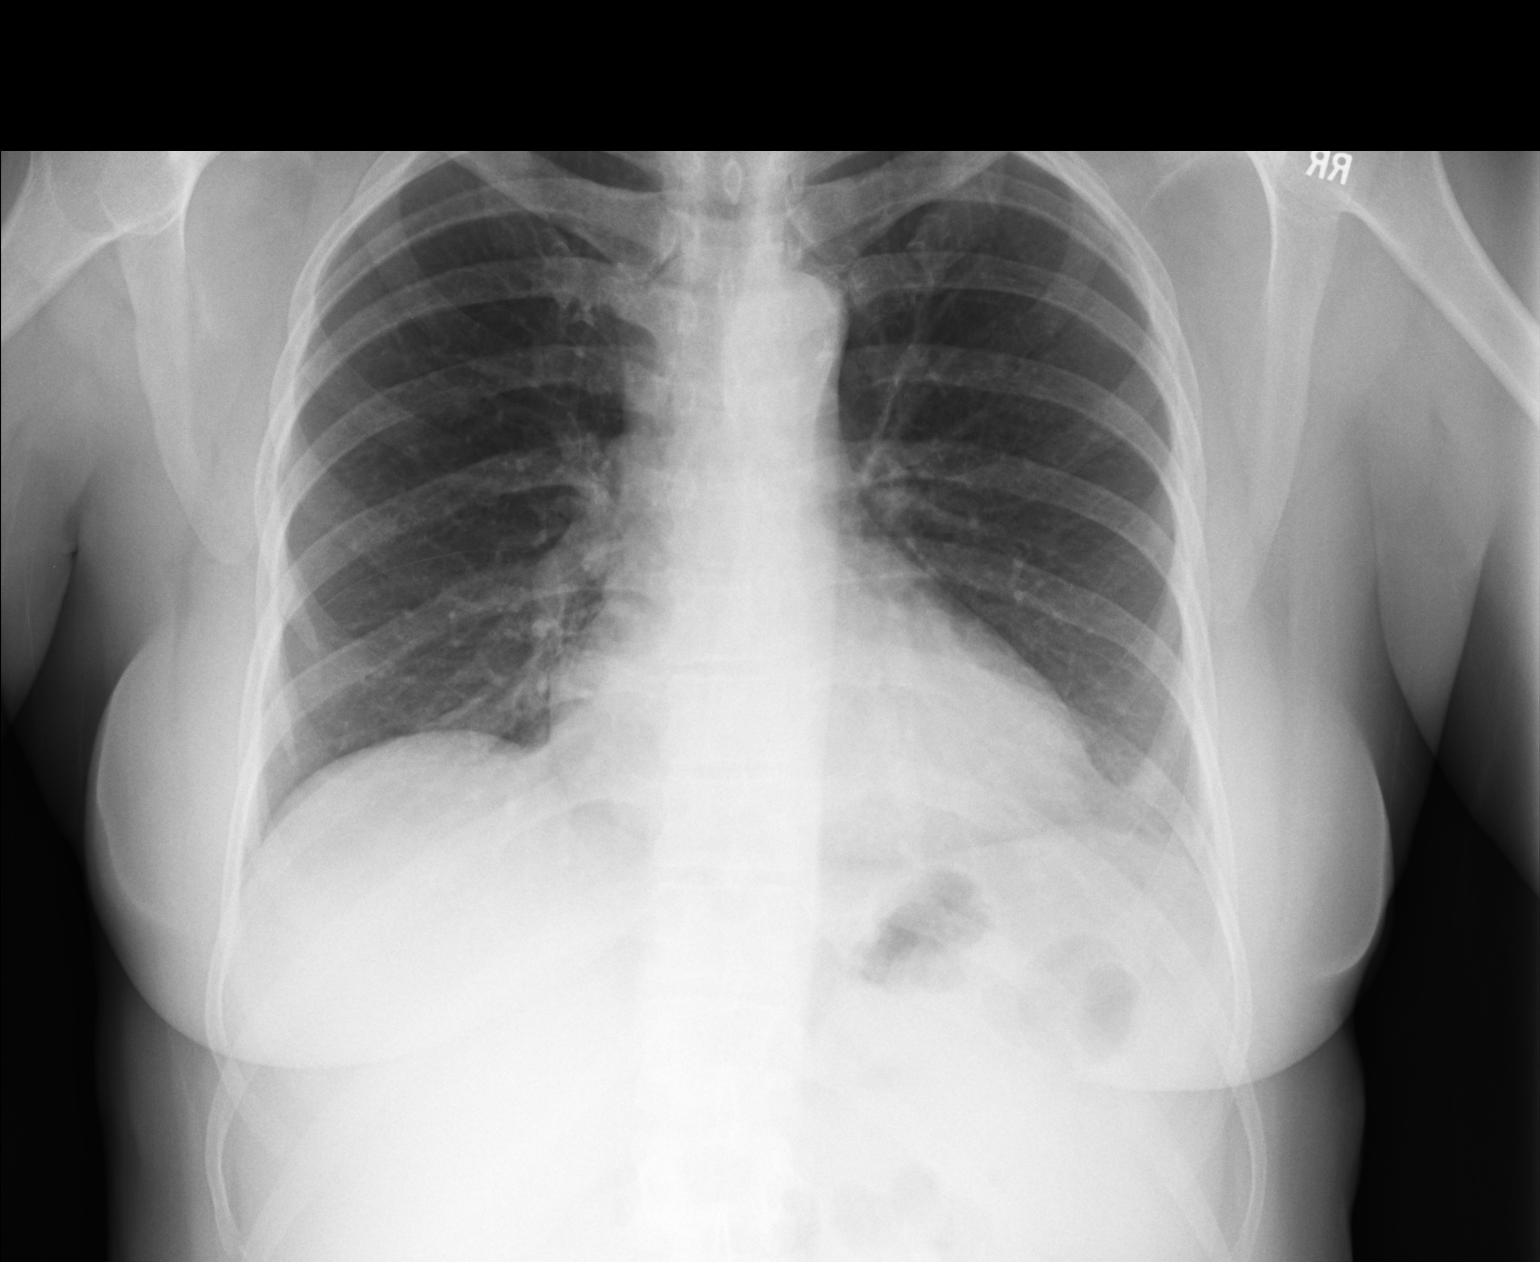

[lateral]
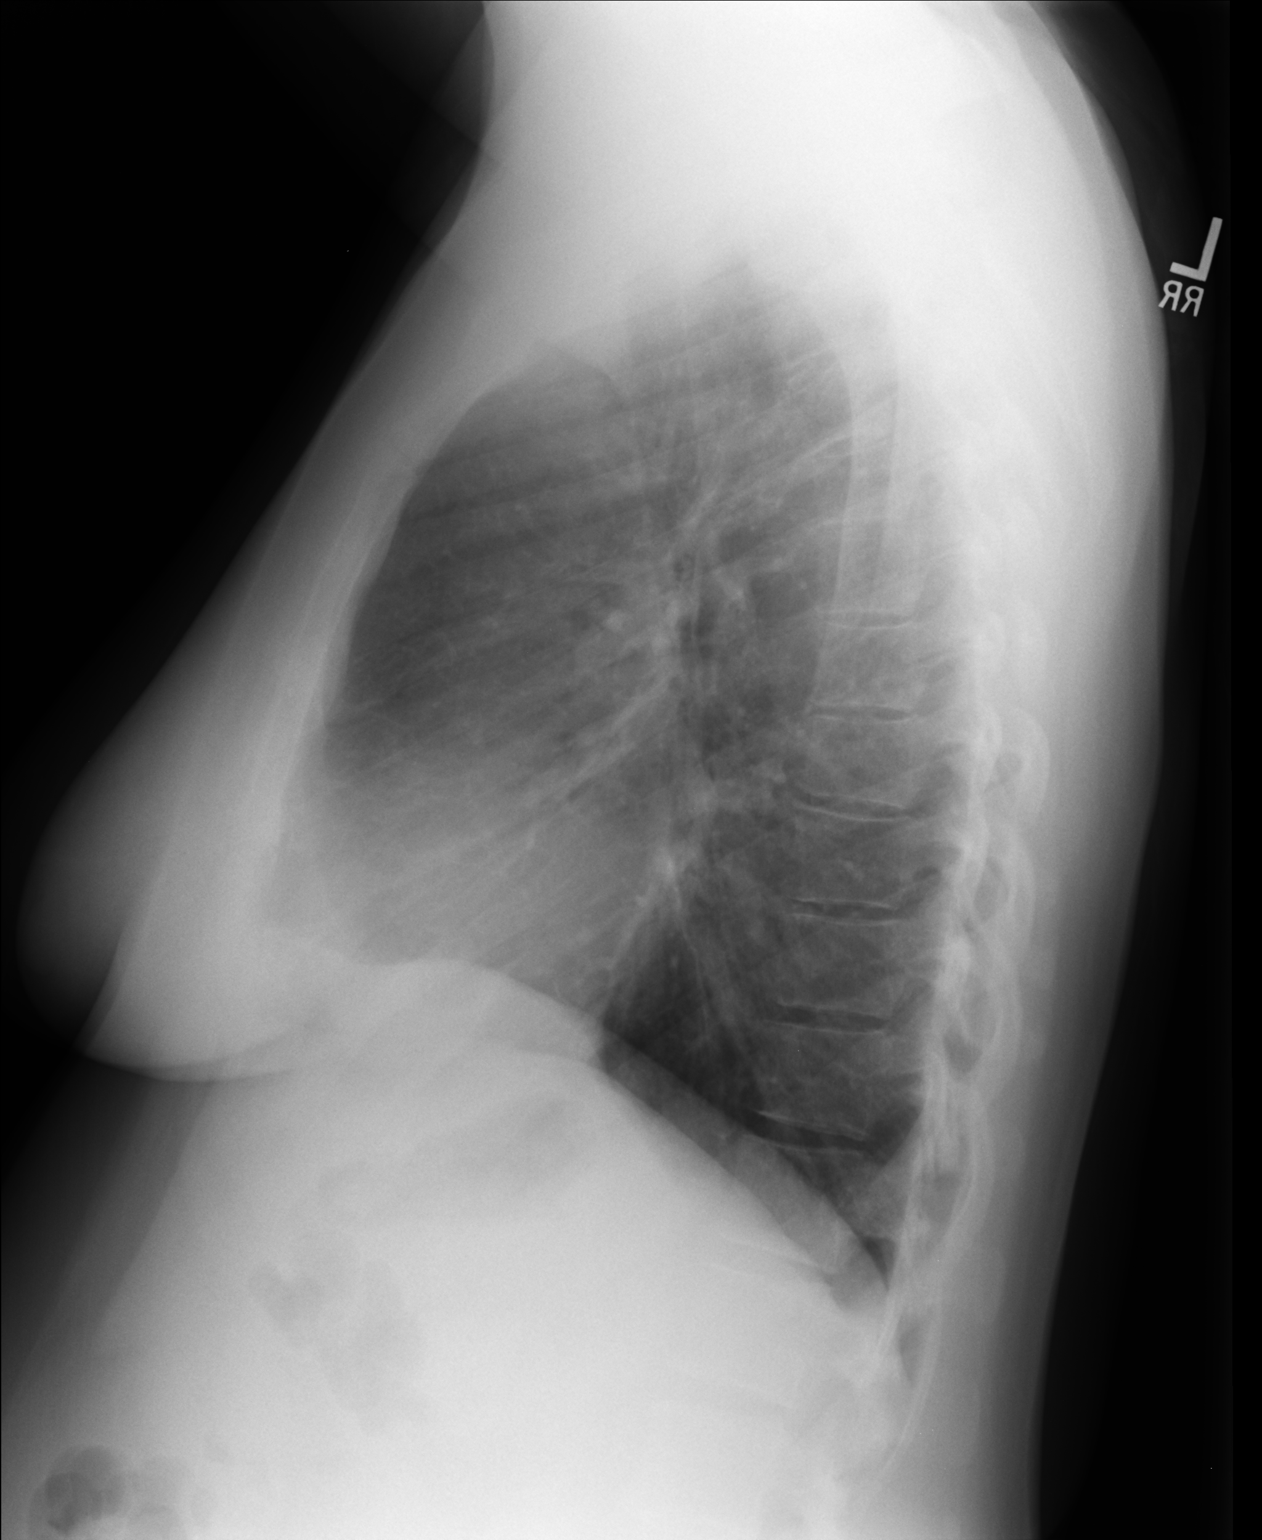

[2 of 2 positions shown; findings below may reference images not displayed]

FINDINGS: Heart and mediastinal contours are within normal limits.
No focal opacities or effusions.  No acute bony abnormality.
IMPRESSION: No active cardiopulmonary disease.

## 2014-01-05 ENCOUNTER — Other Ambulatory Visit: Payer: Self-pay | Admitting: Physician Assistant

## 2014-01-07 NOTE — Telephone Encounter (Signed)
Todd, do you want to RF this for pt?

## 2014-01-07 NOTE — Telephone Encounter (Signed)
She needs to come back and be seen if her muscle spasm in her back is still bothering her. We gave her a 30 day supply of flexeril which should have helped along with rest, ice, and range of motion. I'd like to see her back if she's not better.

## 2014-01-10 NOTE — Telephone Encounter (Signed)
Pt called back. Advised of Todd's message. Pt understood and will come in to be seen.

## 2014-01-13 ENCOUNTER — Ambulatory Visit (INDEPENDENT_AMBULATORY_CARE_PROVIDER_SITE_OTHER): Payer: Managed Care, Other (non HMO) | Admitting: Family Medicine

## 2014-01-13 VITALS — BP 154/98 | HR 98 | Temp 97.7°F | Resp 18 | Ht 67.0 in | Wt 185.0 lb

## 2014-01-13 DIAGNOSIS — I10 Essential (primary) hypertension: Secondary | ICD-10-CM

## 2014-01-13 DIAGNOSIS — M62830 Muscle spasm of back: Secondary | ICD-10-CM

## 2014-01-13 LAB — COMPLETE METABOLIC PANEL WITH GFR
ALT: 24 U/L (ref 0–35)
AST: 19 U/L (ref 0–37)
Albumin: 4.6 g/dL (ref 3.5–5.2)
Alkaline Phosphatase: 78 U/L (ref 39–117)
BUN: 18 mg/dL (ref 6–23)
Calcium: 9.8 mg/dL (ref 8.4–10.5)
Chloride: 101 mEq/L (ref 96–112)
Potassium: 3.6 mEq/L (ref 3.5–5.3)
Sodium: 137 mEq/L (ref 135–145)
Total Protein: 7.2 g/dL (ref 6.0–8.3)

## 2014-01-13 LAB — COMPLETE METABOLIC PANEL WITHOUT GFR
CO2: 23 meq/L (ref 19–32)
Creat: 0.68 mg/dL (ref 0.50–1.10)
GFR, Est African American: >89 mL/min
GFR, Est Non African American: >89 mL/min
Glucose, Bld: 112 mg/dL — ABNORMAL HIGH (ref 70–99)
Total Bilirubin: 0.6 mg/dL (ref 0.2–1.2)

## 2014-01-13 MED ORDER — CYCLOBENZAPRINE HCL 10 MG PO TABS
10.0000 mg | ORAL_TABLET | Freq: Every evening | ORAL | Status: DC | PRN
Start: 2014-01-13 — End: 2014-06-17

## 2014-01-13 NOTE — Progress Notes (Signed)
Chief Complaint:  Chief Complaint  Patient presents with  . rx refills    flexeril    HPI: Megan Gallagher is a 49 y.o. female who is here for  HTN meds  BP has been running 125/85-130s/80s at home.  She had insurance issues and her BP is high right now because of it, she did not know that she signed on to [place] and ahs to have a copay and high deductible, she was not meaning to do that so ahs to change her insurance.  She has not taken her BP meds this morning. No SEs with meds  The flexeril is helping with her right trapezius, shoulder to mid back . She is left handed. She doe snot carry a lot or dod a lot of repetitive motion with her right hand.  When she presses on it it feels better. She would like a refill.     BP Readings from Last 3 Encounters:  01/13/14 154/98  12/10/13 148/85  11/20/13 150/82   Wt Readings from Last 3 Encounters:  01/13/14 185 lb (83.915 kg)  12/10/13 183 lb (83.008 kg)  11/20/13 187 lb (84.823 kg)      Past Medical History  Diagnosis Date  . Hypertension   . Hyperlipidemia    History reviewed. No pertinent past surgical history. History   Social History  . Marital Status: Married    Spouse Name: N/A    Number of Children: N/A  . Years of Education: N/A   Social History Main Topics  . Smoking status: Never Smoker   . Smokeless tobacco: None  . Alcohol Use: No  . Drug Use: No  . Sexual Activity: None   Other Topics Concern  . None   Social History Narrative   History reviewed. No pertinent family history. No Known Allergies Prior to Admission medications   Medication Sig Start Date End Date Taking? Authorizing Provider  cetirizine (ZYRTEC) 10 MG tablet TAKE 1 TABLET BY MOUTH AT BEDTIME 12/22/13  Yes Todd McVeigh, PA  cyclobenzaprine (FLEXERIL) 10 MG tablet Take 1 tablet (10 mg total) by mouth 3 (three) times daily as needed for muscle spasms. 12/10/13  Yes Todd McVeigh, PA  fluticasone (FLONASE) 50 MCG/ACT nasal spray  PLACE 2 SPRAYS INTO BOTH NOSTRILS AT BEDTIME   Yes Shawnee Knapp, MD  hydrochlorothiazide (HYDRODIURIL) 25 MG tablet Take 0.5 tablets (12.5 mg total) by mouth daily. 12/10/13  Yes Todd McVeigh, PA  omeprazole (PRILOSEC) 40 MG capsule Take 1 capsule (40 mg total) by mouth daily. 11/20/13  Yes Todd McVeigh, PA  PARoxetine (PAXIL) 20 MG tablet Take 1 tablet (20 mg total) by mouth daily. 11/20/13  Yes Todd McVeigh, PA  rosuvastatin (CRESTOR) 20 MG tablet Take 1 tablet (20 mg total) by mouth daily. 11/20/13  Yes Araceli Bouche, PA  traZODone (DESYREL) 50 MG tablet Take 0.5-1 tablets (25-50 mg total) by mouth at bedtime as needed for sleep. 11/20/13  Yes Araceli Bouche, PA  omeprazole (PRILOSEC) 20 MG capsule Take 1 capsule (20 mg total) by mouth daily. Patient not taking: Reported on 01/13/2014 12/10/13   Araceli Bouche, PA     ROS: The patient denies fevers, chills, night sweats, unintentional weight loss, chest pain, palpitations, wheezing, dyspnea on exertion, nausea, vomiting, abdominal pain, dysuria, hematuria, melena, numbness, weakness, or tingling.   Gallagher other systems have been reviewed and were otherwise negative with the exception of those mentioned in the HPI and as above.  PHYSICAL EXAM: Filed Vitals:   01/13/14 1235  BP: 154/98  Pulse: 98  Temp: 97.7 F (36.5 C)  Resp: 18   Filed Vitals:   01/13/14 1235  Height: 5\' 7"  (1.702 m)  Weight: 185 lb (83.915 kg)   Body mass index is 28.97 kg/(m^2).  General: Alert, no acute distress HEENT:  Normocephalic, atraumatic, oropharynx patent. EOMI, PERRLA Cardiovascular:  Regular rate and rhythm, no rubs murmurs or gallops.  No Carotid bruits, radial pulse intact. No pedal edema.  Respiratory: Clear to auscultation bilaterally.  No wheezes, rales, or rhonchi.  No cyanosis, no use of accessory musculature GI: No organomegaly, abdomen is soft and non-tender, positive bowel sounds.  No masses. Skin: No rashes. Neurologic: Facial musculature  symmetric. Psychiatric: Patient is appropriate throughout our interaction. Lymphatic: No cervical lymphadenopathy Musculoskeletal: Gait intact. Full ROm + right trapezius tenderness 5/5 strenghth Shoulder ROM Neg Hawkins, neers, empty can   LABS: Results for orders placed or performed in visit on 01/13/14  COMPLETE METABOLIC PANEL WITH GFR  Result Value Ref Range   Sodium 137 135 - 145 mEq/L   Potassium 3.6 3.5 - 5.3 mEq/L   Chloride 101 96 - 112 mEq/L   CO2 23 19 - 32 mEq/L   Glucose, Bld 112 (H) 70 - 99 mg/dL   BUN 18 6 - 23 mg/dL   Creat 0.68 0.50 - 1.10 mg/dL   Total Bilirubin 0.6 0.2 - 1.2 mg/dL   Alkaline Phosphatase 78 39 - 117 U/L   AST 19 0 - 37 U/L   ALT 24 0 - 35 U/L   Total Protein 7.2 6.0 - 8.3 g/dL   Albumin 4.6 3.5 - 5.2 g/dL   Calcium 9.8 8.4 - 10.5 mg/dL   GFR, Est African American >89 mL/min   GFR, Est Non African American >89 mL/min     EKG/XRAY:   Primary read interpreted by Dr. Marin Comment at [facility].   ASSESSMENT/PLAN: Encounter Diagnoses  Name Primary?  . Muscle spasm of back Yes  . Essential hypertension    Refilled flexeril She has enough refillls for HTN , is currently on HCTZ 12.5 mg daily, she was previosuly on norvasc and due to swelling of extremities was switched to hctz, will Recheck CMP Fu in 6 months  Gross sideeffects, risk and benefits, and alternatives of medications d/w patient. Patient is aware that Gallagher medications have potential sideeffects and we are unable to predict every sideeffect or drug-drug interaction that may occur.  Megan Gallagher, [place], DO 01/14/2014 1:48 PM

## 2014-02-08 ENCOUNTER — Other Ambulatory Visit: Payer: Self-pay | Admitting: Family Medicine

## 2014-03-27 ENCOUNTER — Other Ambulatory Visit: Payer: Self-pay | Admitting: Physician Assistant

## 2014-04-15 ENCOUNTER — Other Ambulatory Visit: Payer: Self-pay | Admitting: Emergency Medicine

## 2014-04-15 ENCOUNTER — Other Ambulatory Visit: Payer: Self-pay | Admitting: Physician Assistant

## 2014-06-10 ENCOUNTER — Other Ambulatory Visit: Payer: Self-pay | Admitting: Emergency Medicine

## 2014-06-15 ENCOUNTER — Telehealth: Payer: Self-pay

## 2014-06-15 DIAGNOSIS — G4700 Insomnia, unspecified: Secondary | ICD-10-CM

## 2014-06-15 NOTE — Telephone Encounter (Signed)
Patient is calling to request a refill for trazodone. She states the insurance only coveres the medication if it's a 90 day refill. [facility] [address]

## 2014-06-16 MED ORDER — TRAZODONE HCL 50 MG PO TABS: 25.0000 mg | ORAL_TABLET | Freq: Every evening | ORAL | Status: DC | PRN

## 2014-06-16 NOTE — Telephone Encounter (Signed)
Can we send in 90 day?

## 2014-06-16 NOTE — Telephone Encounter (Signed)
I sent in 90 day prescription. She will need to come back BEFORE med runs out in 90 days for further refills.

## 2014-06-16 NOTE — Telephone Encounter (Signed)
Tried to call pt, number not in service.

## 2014-06-17 ENCOUNTER — Other Ambulatory Visit: Payer: Self-pay | Admitting: Emergency Medicine

## 2014-06-17 ENCOUNTER — Other Ambulatory Visit: Payer: Self-pay | Admitting: Family Medicine

## 2014-06-18 ENCOUNTER — Telehealth: Payer: Self-pay

## 2014-06-18 DIAGNOSIS — G4700 Insomnia, unspecified: Secondary | ICD-10-CM

## 2014-06-18 NOTE — Telephone Encounter (Signed)
Fax from pharm advises that ins will only pay for a 90 day RF.  This is a duplicate mes and was already taken care of 06/16/14.

## 2014-07-10 ENCOUNTER — Other Ambulatory Visit: Payer: Self-pay | Admitting: Physician Assistant

## 2014-07-10 ENCOUNTER — Ambulatory Visit (INDEPENDENT_AMBULATORY_CARE_PROVIDER_SITE_OTHER): Payer: BLUE CROSS/BLUE SHIELD | Admitting: Emergency Medicine

## 2014-07-10 VITALS — BP 110/70 | HR 90 | Temp 98.4°F | Resp 16 | Ht 67.5 in | Wt 196.0 lb

## 2014-07-10 DIAGNOSIS — E7800 Pure hypercholesterolemia, unspecified: Secondary | ICD-10-CM

## 2014-07-10 DIAGNOSIS — F411 Generalized anxiety disorder: Secondary | ICD-10-CM | POA: Diagnosis not present

## 2014-07-10 DIAGNOSIS — I10 Essential (primary) hypertension: Secondary | ICD-10-CM

## 2014-07-10 DIAGNOSIS — G4700 Insomnia, unspecified: Secondary | ICD-10-CM

## 2014-07-10 LAB — POCT UA - MICROSCOPIC ONLY
CASTS, UR, LPF, POC: NEGATIVE
Mucus, UA: NEGATIVE
Yeast, UA: NEGATIVE

## 2014-07-10 LAB — COMPREHENSIVE METABOLIC PANEL
ALK PHOS: 75 U/L (ref 39–117)
ALT: 36 U/L — ABNORMAL HIGH (ref 0–35)
AST: 29 U/L (ref 0–37)
Albumin: 4.4 g/dL (ref 3.5–5.2)
BUN: 7 mg/dL (ref 6–23)
CO2: 25 mEq/L (ref 19–32)
Calcium: 9.3 mg/dL (ref 8.4–10.5)
Chloride: 100 mEq/L (ref 96–112)
Creat: 0.66 mg/dL (ref 0.50–1.10)
Glucose, Bld: 113 mg/dL — ABNORMAL HIGH (ref 70–99)
POTASSIUM: 3.9 meq/L (ref 3.5–5.3)
Sodium: 138 mEq/L (ref 135–145)
Total Bilirubin: 0.6 mg/dL (ref 0.2–1.2)
Total Protein: 7.1 g/dL (ref 6.0–8.3)

## 2014-07-10 LAB — POCT URINALYSIS DIPSTICK
BILIRUBIN UA: NEGATIVE
Blood, UA: NEGATIVE
Glucose, UA: NEGATIVE
Ketones, UA: NEGATIVE
NITRITE UA: NEGATIVE
Protein, UA: NEGATIVE
Spec Grav, UA: 1.02
Urobilinogen, UA: 1
pH, UA: 7

## 2014-07-10 LAB — LIPID PANEL
CHOL/HDL RATIO: 3.4 ratio
Cholesterol: 162 mg/dL (ref 0–200)
HDL: 47 mg/dL (ref >=46)
LDL Cholesterol: 77 mg/dL (ref 0–99)
TRIGLYCERIDES: 188 mg/dL — AB (ref <150)
VLDL: 38 mg/dL (ref 0–40)

## 2014-07-10 LAB — CBC
HEMATOCRIT: 44.1 % (ref 36.0–46.0)
Hemoglobin: 14.5 g/dL (ref 12.0–15.0)
MCH: 28.7 pg (ref 26.0–34.0)
MCHC: 32.9 g/dL (ref 30.0–36.0)
MCV: 87.2 fL (ref 78.0–100.0)
MPV: 9.3 fL (ref 8.6–12.4)
Platelets: 293 10*3/uL (ref 150–400)
RBC: 5.06 MIL/uL (ref 3.87–5.11)
RDW: 14 % (ref 11.5–15.5)
WBC: 9.3 10*3/uL (ref 4.0–10.5)

## 2014-07-10 MED ORDER — CYCLOBENZAPRINE HCL 10 MG PO TABS: ORAL_TABLET | ORAL | Status: DC

## 2014-07-10 NOTE — Patient Instructions (Signed)

## 2014-07-10 NOTE — Progress Notes (Signed)
Subjective:  Patient ID: Megan Gallagher, female    DOB: 1965-04-11  Age: 49 y.o. MRN: [id number]  CC: Medication Refill   HPI Megan Gallagher presents  for refill of Megan Gallagher prescriptions. Megan Gallagher usually sees Dr. Everlene Farrier. Megan Gallagher is out of some some of Megan Gallagher medication today. Megan Gallagher has no symptoms referable to end organ injury. Megan Gallagher anxiety and insomnia are reasonably good control. Tolerating the medication without any adverse effect. No acute complaint of new medical problems..  History Megan Gallagher has a past medical history of Hypertension and Hyperlipidemia.   Megan Gallagher has no past surgical history on file.   Megan Gallagher  family history is not on file.  Megan Gallagher   reports that Megan Gallagher has never smoked. Megan Gallagher does not have any smokeless tobacco history on file. Megan Gallagher reports that Megan Gallagher does not drink alcohol or use illicit drugs.  Outpatient Prescriptions Prior to Visit  Medication Sig Dispense Refill  . cetirizine (ZYRTEC) 10 MG tablet TAKE 1 TABLET BY MOUTH AT BEDTIME 30 tablet 11  . fluticasone (FLONASE) 50 MCG/ACT nasal spray INSTILL 2 SPRAYS INTO BOTH NOSTRILS AT BEDTIME 16 g 9  . hydrochlorothiazide (HYDRODIURIL) 25 MG tablet Take 0.5 tablets (12.5 mg total) by mouth daily. 90 tablet 3  . omeprazole (PRILOSEC) 40 MG capsule Take 1 capsule (40 mg total) by mouth daily. 30 capsule 3  . PARoxetine (PAXIL) 20 MG tablet TAKE 1 TABLET BY MOUTH DAILY 30 tablet 2  . rosuvastatin (CRESTOR) 20 MG tablet Take 1 tablet (20 mg total) by mouth daily. 30 tablet 11  . traZODone (DESYREL) 50 MG tablet Take 0.5-1 tablets (25-50 mg total) by mouth at bedtime as needed. for sleep. PATIENT NEEDS OFFICE VISIT FOR ADDITIONAL REFILLS 90 tablet 0  . cyclobenzaprine (FLEXERIL) 10 MG tablet TAKE 1 TABLET BY MOUTH AT BEDTIME AS NEEDED FOR MUSCLE SPASMS 30 tablet 0   No facility-administered medications prior to visit.    History   Social History  . Marital Status: Married    Spouse Name: N/A  . Number of Children: N/A  . Years of Education: N/A    Social History Main Topics  . Smoking status: Never Smoker   . Smokeless tobacco: Not on file  . Alcohol Use: No  . Drug Use: No  . Sexual Activity: Not on file   Other Topics Concern  . None   Social History Narrative     Review of Systems  Constitutional: Negative for fever, chills and appetite change.  HENT: Negative for congestion, ear pain, postnasal drip, sinus pressure and sore throat.   Eyes: Negative for pain and redness.  Respiratory: Negative for cough, shortness of breath and wheezing.   Cardiovascular: Negative for leg swelling.  Gastrointestinal: Negative for nausea, vomiting, abdominal pain, diarrhea, constipation and blood in stool.  Endocrine: Negative for polyuria.  Genitourinary: Negative for dysuria, urgency, frequency and flank pain.  Musculoskeletal: Negative for gait problem.  Skin: Negative for rash.  Neurological: Negative for weakness and headaches.  Psychiatric/Behavioral: Negative for confusion and decreased concentration. The patient is not nervous/anxious.     Objective:  BP 110/70 mmHg  Pulse 90  Temp(Src) 98.4 F (36.9 C) (Oral)  Resp 16  Ht 5' 7.5" (1.715 m)  Wt 196 lb (88.905 kg)  BMI 30.23 kg/m2  SpO2 98%  LMP 06/05/2011  Physical Exam  Constitutional: Megan Gallagher is oriented to person, place, and time. Megan Gallagher appears well-developed and well-nourished.  HENT:  Head: Normocephalic and atraumatic.  Eyes: Conjunctivae are normal. Pupils  are equal, round, and reactive to light.  Pulmonary/Chest: Effort normal.  Musculoskeletal: Megan Gallagher exhibits no edema.  Neurological: Megan Gallagher is alert and oriented to person, place, and time.  Skin: Skin is dry.  Psychiatric: Megan Gallagher has a normal mood and affect. Megan Gallagher behavior is normal. Thought content normal.      Assessment & Plan:   Megan Gallagher was seen today for medication refill.  Diagnoses and all orders for this visit:  Essential hypertension Orders: -     Comprehensive metabolic panel -     Lipid panel -      CBC -     POCT UA - Microscopic Only -     POCT urinalysis dipstick  High cholesterol Orders: -     Comprehensive metabolic panel -     Lipid panel -     CBC -     POCT UA - Microscopic Only -     POCT urinalysis dipstick  Insomnia Orders: -     Comprehensive metabolic panel -     Lipid panel -     CBC -     POCT UA - Microscopic Only -     POCT urinalysis dipstick  Generalized anxiety disorder Orders: -     Comprehensive metabolic panel -     Lipid panel -     CBC -     POCT UA - Microscopic Only -     POCT urinalysis dipstick  Other orders -     cyclobenzaprine (FLEXERIL) 10 MG tablet; TAKE 1 TABLET BY MOUTH AT BEDTIME AS NEEDED FOR MUSCLE SPASMS   I am having Megan Gallagher maintain Megan Gallagher omeprazole, rosuvastatin, hydrochlorothiazide, cetirizine, fluticasone, PARoxetine, traZODone, and cyclobenzaprine.  Meds ordered this encounter  Medications  . cyclobenzaprine (FLEXERIL) 10 MG tablet    Sig: TAKE 1 TABLET BY MOUTH AT BEDTIME AS NEEDED FOR MUSCLE SPASMS    Dispense:  30 tablet    Refill:  0   Megan Gallagher lab work is pending and Megan Gallagher will call Megan Gallagher tomorrow with the results  Appropriate red flag conditions were discussed with the patient as well as actions that should be taken.  Patient expressed his understanding.  Follow-up: Return if symptoms worsen or fail to improve.  Roselee Culver, MD

## 2014-07-11 ENCOUNTER — Other Ambulatory Visit: Payer: Self-pay | Admitting: Emergency Medicine

## 2014-07-11 ENCOUNTER — Telehealth: Payer: Self-pay | Admitting: Radiology

## 2014-07-11 DIAGNOSIS — G4700 Insomnia, unspecified: Secondary | ICD-10-CM

## 2014-07-11 MED ORDER — PAROXETINE HCL 20 MG PO TABS: 20.0000 mg | ORAL_TABLET | Freq: Every day | ORAL | Status: DC

## 2014-07-11 MED ORDER — TRAZODONE HCL 50 MG PO TABS: 25.0000 mg | ORAL_TABLET | Freq: Every evening | ORAL | Status: DC | PRN

## 2014-07-11 NOTE — Telephone Encounter (Signed)
Per Dr. Tonette Bihari note, he agreed to refill Paxil and Trazodone. Not sure what happened to these. Refilled today. Please notify patient.

## 2014-08-11 ENCOUNTER — Other Ambulatory Visit: Payer: Self-pay | Admitting: Emergency Medicine

## 2014-08-13 ENCOUNTER — Other Ambulatory Visit: Payer: Self-pay | Admitting: Physician Assistant

## 2014-08-14 ENCOUNTER — Other Ambulatory Visit: Payer: Self-pay | Admitting: Family Medicine

## 2014-08-15 MED ORDER — OMEPRAZOLE 20 MG PO CPDR: 20.0000 mg | DELAYED_RELEASE_CAPSULE | Freq: Every day | ORAL | Status: DC

## 2014-08-15 NOTE — Addendum Note (Signed)
Addended by: Kem Boroughs D on: 08/15/2014 10:42 AM   Modules accepted: Orders

## 2014-08-25 ENCOUNTER — Other Ambulatory Visit: Payer: Self-pay | Admitting: Physician Assistant

## 2014-08-31 ENCOUNTER — Other Ambulatory Visit: Payer: Self-pay | Admitting: Physician Assistant

## 2014-09-01 ENCOUNTER — Other Ambulatory Visit: Payer: Self-pay | Admitting: Physician Assistant

## 2014-09-09 ENCOUNTER — Other Ambulatory Visit: Payer: Self-pay | Admitting: Physician Assistant

## 2014-09-28 ENCOUNTER — Other Ambulatory Visit: Payer: Self-pay | Admitting: Physician Assistant

## 2014-10-05 ENCOUNTER — Other Ambulatory Visit: Payer: Self-pay | Admitting: Urgent Care

## 2014-10-06 ENCOUNTER — Telehealth: Payer: Self-pay | Admitting: Physician Assistant

## 2014-10-06 ENCOUNTER — Encounter: Payer: Self-pay | Admitting: Emergency Medicine

## 2014-10-06 DIAGNOSIS — G4700 Insomnia, unspecified: Secondary | ICD-10-CM

## 2014-10-07 ENCOUNTER — Telehealth: Payer: Self-pay

## 2014-10-07 NOTE — Telephone Encounter (Signed)
She needs to come in and get this situated. I have not seen the patient since 2013

## 2014-10-07 NOTE — Telephone Encounter (Signed)
PATIENT WOULD LIKE DR. DAUB TO KNOW THAT SHE NEEDS REFILLS ON HER PAROXETINE AND CYCLOBENZAPRINE 10 MG. SHE NEEDS A 90 DAY SUPPLY ON BOTH SO THAT HER INSURANCE WILL COVER IT. BEST PHONE [phone] IS WORK UNTIL 4:00. AFTER THAT PLEASE CALL HER CELL AT [phone].  PHARMACY CHOICE IS [place] ON [address].  [place]

## 2014-10-09 NOTE — Telephone Encounter (Signed)
Patient called for her refill on traZODone (DESYREL) 50 MG tablet

## 2014-10-09 NOTE — Telephone Encounter (Signed)
Left message for pt to RTC.

## 2014-10-12 MED ORDER — TRAZODONE HCL 50 MG PO TABS: 25.0000 mg | ORAL_TABLET | Freq: Every evening | ORAL | Status: DC | PRN

## 2014-10-12 NOTE — Telephone Encounter (Signed)
Rx sent 

## 2014-11-03 ENCOUNTER — Other Ambulatory Visit: Payer: Self-pay | Admitting: Physician Assistant

## 2014-11-27 ENCOUNTER — Ambulatory Visit (INDEPENDENT_AMBULATORY_CARE_PROVIDER_SITE_OTHER): Payer: BLUE CROSS/BLUE SHIELD | Admitting: Family Medicine

## 2014-11-27 VITALS — BP 128/88 | HR 95 | Temp 98.0°F | Resp 16 | Ht 67.0 in | Wt 194.0 lb

## 2014-11-27 DIAGNOSIS — Z1231 Encounter for screening mammogram for malignant neoplasm of breast: Secondary | ICD-10-CM

## 2014-11-27 DIAGNOSIS — G4700 Insomnia, unspecified: Secondary | ICD-10-CM

## 2014-11-27 DIAGNOSIS — Z1211 Encounter for screening for malignant neoplasm of colon: Secondary | ICD-10-CM | POA: Diagnosis not present

## 2014-11-27 DIAGNOSIS — Z23 Encounter for immunization: Secondary | ICD-10-CM | POA: Diagnosis not present

## 2014-11-27 DIAGNOSIS — M62830 Muscle spasm of back: Secondary | ICD-10-CM | POA: Diagnosis not present

## 2014-11-27 MED ORDER — TRAZODONE HCL 50 MG PO TABS
25.0000 mg | ORAL_TABLET | Freq: Every evening | ORAL | Status: DC | PRN
Start: 2014-11-27 — End: 2014-12-16

## 2014-11-27 MED ORDER — CYCLOBENZAPRINE HCL 10 MG PO TABS: ORAL_TABLET | ORAL | Status: DC

## 2014-11-27 NOTE — Progress Notes (Signed)
   Subjective:    Patient ID: Megan Gallagher, female    DOB: [birth year], 49 y.o.   MRN: [id number]  HPI This is a pleasant 49 yo female who presents today for follow up of HTN, muscle spasms (neck and back). She sees Dr. Everlene Farrier, typically at 102. She has been doing well. She realizes she is behind on her mammogram and she needs a screening colonoscopy. She is willing to have those. She had her flu shot and pneumonia shot this year at the [facility] where she works. She is past due on Pap.   Past Medical History  Diagnosis Date  . Hypertension   . Hyperlipidemia   History reviewed. No pertinent past surgical history. History reviewed. No pertinent family history. Social History  Substance Use Topics  . Smoking status: Never Smoker   . Smokeless tobacco: Never Used  . Alcohol Use: No    Review of Systems No chest pian, no SOB, no swelling of feet or ankles.     Objective:   Physical Exam Physical Exam  Constitutional: Oriented to person, place, and time. She appears well-developed and well-nourished.  HENT:  Head: Normocephalic and atraumatic.  Eyes: Conjunctivae are normal.  Neck: Normal range of motion. Neck supple.  Cardiovascular: Normal rate, regular rhythm and normal heart sounds.   Pulmonary/Chest: Effort normal and breath sounds normal.  Musculoskeletal: Normal range of motion.  Neurological: Alert and oriented to person, place, and time.  Skin: Skin is warm and dry.  Psychiatric: Normal mood and affect. Behavior is normal. Judgment and thought content normal.  Vitals reviewed.  BP 128/88 mmHg  Pulse 95  Temp(Src) 98 F (36.7 C) (Oral)  Resp 16  Ht 5\' 7"  (1.702 m)  Wt 194 lb (87.998 kg)  BMI 30.38 kg/m2  SpO2 96%  LMP 06/05/2011 Wt Readings from Last 3 Encounters:  11/27/14 194 lb (87.998 kg)  07/10/14 196 lb (88.905 kg)  01/13/14 185 lb (83.915 kg)      Assessment & Plan:  1. Insomnia - traZODone (DESYREL) 50 MG tablet; Take 0.5-1 tablets (25-50 mg total) by  mouth at bedtime as needed.  Dispense: 90 tablet; Refill: 1  2. Muscle spasm of back - cyclobenzaprine (FLEXERIL) 10 MG tablet; TAKE 1 TABLET BY MOUTH AT BEDTIME AS NEEDED FOR MUSCLE SPASMS  Dispense: 30 tablet; Refill: 3  3. Need for Tdap vaccination - she will get this at her work place  4. Colon cancer screening - Ambulatory referral to Gastroenterology  5. Visit for screening mammogram - MM Digital Screening; Future  - she agreed to follow up for CPE at 102 for her next follow up visit, will check labs at that time.   Clarene Reamer, FNP-BC  [facility]  11/27/2014 4:05 PM

## 2014-12-01 ENCOUNTER — Encounter: Payer: Self-pay | Admitting: Internal Medicine

## 2014-12-16 ENCOUNTER — Ambulatory Visit (INDEPENDENT_AMBULATORY_CARE_PROVIDER_SITE_OTHER): Payer: BLUE CROSS/BLUE SHIELD | Admitting: Family Medicine

## 2014-12-16 ENCOUNTER — Encounter: Payer: Self-pay | Admitting: Family Medicine

## 2014-12-16 VITALS — BP 144/92 | HR 80 | Temp 97.7°F | Resp 16 | Ht 66.5 in | Wt 193.0 lb

## 2014-12-16 DIAGNOSIS — F418 Other specified anxiety disorders: Secondary | ICD-10-CM

## 2014-12-16 DIAGNOSIS — Z1231 Encounter for screening mammogram for malignant neoplasm of breast: Secondary | ICD-10-CM | POA: Diagnosis not present

## 2014-12-16 DIAGNOSIS — Z0000 Encounter for general adult medical examination without abnormal findings: Secondary | ICD-10-CM | POA: Diagnosis not present

## 2014-12-16 DIAGNOSIS — G4700 Insomnia, unspecified: Secondary | ICD-10-CM

## 2014-12-16 DIAGNOSIS — F329 Major depressive disorder, single episode, unspecified: Secondary | ICD-10-CM

## 2014-12-16 DIAGNOSIS — M62830 Muscle spasm of back: Secondary | ICD-10-CM

## 2014-12-16 DIAGNOSIS — E7800 Pure hypercholesterolemia, unspecified: Secondary | ICD-10-CM | POA: Diagnosis not present

## 2014-12-16 DIAGNOSIS — Z124 Encounter for screening for malignant neoplasm of cervix: Secondary | ICD-10-CM

## 2014-12-16 DIAGNOSIS — I10 Essential (primary) hypertension: Secondary | ICD-10-CM

## 2014-12-16 DIAGNOSIS — F419 Anxiety disorder, unspecified: Secondary | ICD-10-CM

## 2014-12-16 DIAGNOSIS — L989 Disorder of the skin and subcutaneous tissue, unspecified: Secondary | ICD-10-CM | POA: Diagnosis not present

## 2014-12-16 DIAGNOSIS — F32A Depression, unspecified: Secondary | ICD-10-CM

## 2014-12-16 LAB — COMPREHENSIVE METABOLIC PANEL
ALK PHOS: 81 U/L (ref 33–130)
ALT: 29 U/L (ref 6–29)
AST: 27 U/L (ref 10–35)
Albumin: 4.6 g/dL (ref 3.6–5.1)
BUN: 9 mg/dL (ref 7–25)
CHLORIDE: 98 mmol/L (ref 98–110)
CO2: 27 mmol/L (ref 20–31)
CREATININE: 0.73 mg/dL (ref 0.50–1.05)
Calcium: 10.2 mg/dL (ref 8.6–10.4)
Glucose, Bld: 119 mg/dL — ABNORMAL HIGH (ref 65–99)
Potassium: 4.1 mmol/L (ref 3.5–5.3)
Sodium: 139 mmol/L (ref 135–146)
TOTAL PROTEIN: 7.6 g/dL (ref 6.1–8.1)
Total Bilirubin: 0.5 mg/dL (ref 0.2–1.2)

## 2014-12-16 LAB — LIPID PANEL
CHOL/HDL RATIO: 4.2 ratio (ref <=5.0)
Cholesterol: 185 mg/dL (ref 125–200)
HDL: 44 mg/dL — AB (ref >=46)
LDL CALC: 86 mg/dL (ref <130)
TRIGLYCERIDES: 273 mg/dL — AB (ref <150)
VLDL: 55 mg/dL — AB (ref <30)

## 2014-12-16 LAB — CBC
HCT: 44 % (ref 36.0–46.0)
Hemoglobin: 14.6 g/dL (ref 12.0–15.0)
MCH: 28.8 pg (ref 26.0–34.0)
MCHC: 33.2 g/dL (ref 30.0–36.0)
MCV: 86.8 fL (ref 78.0–100.0)
MPV: 9.5 fL (ref 8.6–12.4)
PLATELETS: 253 10*3/uL (ref 150–400)
RBC: 5.07 MIL/uL (ref 3.87–5.11)
RDW: 14.1 % (ref 11.5–15.5)
WBC: 7.9 10*3/uL (ref 4.0–10.5)

## 2014-12-16 LAB — TSH: TSH: 3.841 u[IU]/mL (ref 0.350–4.500)

## 2014-12-16 MED ORDER — PAROXETINE HCL 20 MG PO TABS: 20.0000 mg | ORAL_TABLET | Freq: Every day | ORAL | Status: DC

## 2014-12-16 MED ORDER — CYCLOBENZAPRINE HCL 10 MG PO TABS: ORAL_TABLET | ORAL | Status: AC

## 2014-12-16 MED ORDER — TRAZODONE HCL 50 MG PO TABS: 25.0000 mg | ORAL_TABLET | Freq: Every evening | ORAL | Status: AC | PRN

## 2014-12-16 NOTE — Patient Instructions (Signed)
Try to exercise at least 3 times a week. Add some stretching/gentle yoga  Constipation, Adult Constipation is when a person has fewer than three bowel movements a week, has difficulty having a bowel movement, or has stools that are dry, hard, or larger than normal. As people grow older, constipation is more common. A low-fiber diet, not taking in enough fluids, and taking certain medicines may make constipation worse.  CAUSES   Certain medicines, such as antidepressants, pain medicine, iron supplements, antacids, and water pills.   Certain diseases, such as diabetes, irritable bowel syndrome (IBS), thyroid disease, or depression.   Not drinking enough water.   Not eating enough fiber-rich foods.   Stress or travel.   Lack of physical activity or exercise.   Ignoring the urge to have a bowel movement.   Using laxatives too much.  SIGNS AND SYMPTOMS   Having fewer than three bowel movements a week.   Straining to have a bowel movement.   Having stools that are hard, dry, or larger than normal.   Feeling full or bloated.   Pain in the lower abdomen.   Not feeling relief after having a bowel movement.  DIAGNOSIS  Your health care provider will take a medical history and perform a physical exam. Further testing may be done for severe constipation. Some tests may include:  A barium enema X-ray to examine your rectum, colon, and, sometimes, your small intestine.   A sigmoidoscopy to examine your lower colon.   A colonoscopy to examine your entire colon. TREATMENT  Treatment will depend on the severity of your constipation and what is causing it. Some dietary treatments include drinking more fluids and eating more fiber-rich foods. Lifestyle treatments may include regular exercise. If these diet and lifestyle recommendations do not help, your health care provider may recommend taking over-the-counter laxative medicines to help you have bowel movements. Prescription  medicines may be prescribed if over-the-counter medicines do not work.  HOME CARE INSTRUCTIONS   Eat foods that have a lot of fiber, such as fruits, vegetables, whole grains, and beans.  Limit foods high in fat and processed sugars, such as french fries, hamburgers, cookies, candies, and soda.   A fiber supplement may be added to your diet if you cannot get enough fiber from foods.   Drink enough fluids to keep your urine clear or pale yellow.   Exercise regularly or as directed by your health care provider.   Go to the restroom when you have the urge to go. Do not hold it.   Only take over-the-counter or prescription medicines as directed by your health care provider. Do not take other medicines for constipation without talking to your health care provider first.  [place] IF:   You have bright red blood in your stool.   Your constipation lasts for more than 4 days or gets worse.   You have abdominal or rectal pain.   You have thin, pencil-like stools.   You have unexplained weight loss. MAKE SURE YOU:   Understand these instructions.  Will watch your condition.  Will get help right away if you are not doing well or get worse.   This information is not intended to replace advice given to you by your health care provider. Make sure you discuss any questions you have with your health care provider.   Document Released: 09/17/2003 Document Revised: 01/09/2014 Document Reviewed: 09/30/2012 Elsevier Interactive Patient Education [employer].

## 2014-12-16 NOTE — Progress Notes (Signed)
Subjective:    Patient ID: Megan Gallagher, female    DOB: 04/04/1965, 49 y.o.   MRN: [id number]  HPI This is a pleasant 49 yo female who presents today for her CPE. She is married. Her sons live with her and her two grandchildren (ages 33,2). She works at [employer]. She enjoys spending time with her grandchildren and crocheting.   Last CPE- several years ago Mammo- order placed  Pap-several years ago LMP- 1.5 years ago Colonoscopy- scheduled for February Tdap- 11/26/14 Pneumonia- 04/22/14 Flu- annual Eye- annual Dental- not regular Exercise- not regular, likes to walk, has physical job with frequent lifting  Past Medical History  Diagnosis Date  . Hypertension   . Hyperlipidemia    History reviewed. No pertinent past surgical history. Family History  Problem Relation Age of Onset  . Alzheimer's disease Mother   . Heart disease Father    Social History  Substance Use Topics  . Smoking status: Never Smoker   . Smokeless tobacco: Never Used  . Alcohol Use: No   Review of Systems  Constitutional: Negative.   HENT: Negative.   Eyes: Negative.   Respiratory: Negative.   Cardiovascular: Negative.   Gastrointestinal: Positive for constipation (BM every 2-3 days, no change, doesn't drink much water or eat many vegetables. ).  Endocrine: Negative.   Genitourinary: Negative.   Musculoskeletal: Positive for back pain (with lifiting at work, upprer back spasms after working.).  Skin:       Has noticed cystic lesion between her eyes. Her husband lanced it and expressed contents. Lesion continues to get bigger.    Allergic/Immunologic: Negative.   Neurological: Negative.   Hematological: Negative.   Psychiatric/Behavioral: Positive for sleep disturbance (better with trazadone). The patient is nervous/anxious.       Objective:   Physical Exam Physical Exam  Constitutional: She is oriented to person, place, and time. She appears well-developed and well-nourished. No distress.    HENT:  Head: Normocephalic and atraumatic.  Right Ear: External ear normal.  Left Ear: External ear normal.  Nose: Nose normal.  Mouth/Throat: Oropharynx is clear and moist. No oropharyngeal exudate.  Eyes: Conjunctivae are normal. Pupils are equal, round, and reactive to light.  Neck: Normal range of motion. Neck supple. No JVD present. No thyromegaly present.  Cardiovascular: Normal rate, regular rhythm, normal heart sounds and intact distal pulses.   Pulmonary/Chest: Effort normal and breath sounds normal. Right breast exhibits no inverted nipple, no mass, no nipple discharge, no skin change and no tenderness. Left breast exhibits no inverted nipple, no mass, no nipple discharge, no skin change and no tenderness. Breasts are symmetrical.  Abdominal: Soft. Bowel sounds are normal. She exhibits no distension and no mass. There is no tenderness. There is no rebound and no guarding.  Genitourinary: Vagina normal. Pelvic exam was performed with patient supine. There is no rash, tenderness, lesion or injury on the right labia. There is no rash, tenderness, lesion or injury on the left labia. Cervix exhibits no motion tenderness and no discharge. No vaginal discharge found.  Musculoskeletal: Normal range of motion. She exhibits no edema or tenderness.  Lymphadenopathy:    She has no cervical adenopathy.  Neurological: She is alert and oriented to person, place, and time. She has normal reflexes.  Skin: Skin is warm and dry. She is not diaphoretic. She has a 6 mm slightly reddened cystic area between her eyes. Non tender.  Psychiatric: She has a normal mood and affect. Her behavior is normal.  Judgment and thought content normal.  Vitals reviewed. BP 144/92 mmHg  Pulse 80  Temp(Src) 97.7 F (36.5 C)  Resp 16  Ht 5' 6.5" (1.689 m)  Wt 193 lb (87.544 kg)  BMI 30.69 kg/m2  LMP 06/05/2011 Wt Readings from Last 3 Encounters:  12/16/14 193 lb (87.544 kg)  11/27/14 194 lb (87.998 kg)  07/10/14  196 lb (88.905 kg)   Depression screen [facility] 2/9 12/16/2014 11/27/2014 07/10/2014 12/10/2013  Decreased Interest 0 0 0 0  Down, Depressed, Hopeless 0 0 0 0  PHQ - 2 Score 0 0 0 0      Assessment & Plan:  1. Annual physical exam - encouraged regular exercise, stress relief  2. Essential hypertension - CBC - Comprehensive metabolic panel  3. High cholesterol - Lipid panel  4. Insomnia - TSH - traZODone (DESYREL) 50 MG tablet; Take 0.5-1 tablets (25-50 mg total) by mouth at bedtime as needed.  Dispense: 90 tablet; Refill: 1  5. Anxiety and depression - PARoxetine (PAXIL) 20 MG tablet; Take 1 tablet (20 mg total) by mouth daily.  Dispense: 90 tablet; Refill: 1  6. Screening for cervical cancer - Pap IG, CT/NG w/ reflex HPV when ASC-U  7. Visit for screening mammogram - order placed for screening mammogram at last visit  8. Muscle spasm of back - cyclobenzaprine (FLEXERIL) 10 MG tablet; TAKE 1 TABLET BY MOUTH AT BEDTIME AS NEEDED FOR MUSCLE SPASMS  Dispense: 30 tablet; Refill: 3 - encouraged her to not use cyclobenzaprine daily, to try to use on days she works  9. Skin lesion of face - Ambulatory referral to Dermatology  - follow up 6 months  Clarene Reamer, FNP-BC  [facility] [place] [facility]  12/16/2014 1:37 PM

## 2014-12-18 LAB — PAP IG, CT-NG, RFX HPV ASCU
Chlamydia Probe Amp: NOT DETECTED
GC Probe Amp: NOT DETECTED

## 2014-12-31 ENCOUNTER — Other Ambulatory Visit: Payer: Self-pay | Admitting: Physician Assistant

## 2015-01-12 ENCOUNTER — Other Ambulatory Visit: Payer: Self-pay | Admitting: Physician Assistant

## 2015-01-13 ENCOUNTER — Other Ambulatory Visit: Payer: Self-pay | Admitting: Emergency Medicine

## 2015-01-15 NOTE — Telephone Encounter (Signed)
Megan Gallagher, you saw pt in Dec for CPE, but don't see GERD discussed. Can we give RFs?

## 2015-01-25 ENCOUNTER — Ambulatory Visit
Admission: RE | Admit: 2015-01-25 | Discharge: 2015-01-25 | Disposition: A | Discharge status: Home / Self Care | Payer: BLUE CROSS/BLUE SHIELD | Source: Ambulatory Visit | Attending: Family Medicine | Admitting: Family Medicine

## 2015-01-25 ENCOUNTER — Ambulatory Visit (AMBULATORY_SURGERY_CENTER): Payer: Self-pay | Admitting: *Deleted

## 2015-01-25 VITALS — Ht 66.5 in | Wt 192.0 lb

## 2015-01-25 DIAGNOSIS — Z1231 Encounter for screening mammogram for malignant neoplasm of breast: Secondary | ICD-10-CM

## 2015-01-25 DIAGNOSIS — Z1211 Encounter for screening for malignant neoplasm of colon: Secondary | ICD-10-CM

## 2015-01-25 DIAGNOSIS — Z800 Family history of malignant neoplasm of digestive organs: Secondary | ICD-10-CM

## 2015-01-25 MED ORDER — NA SULFATE-K SULFATE-MG SULF 17.5-3.13-1.6 GM/177ML PO SOLN: 1.0000 | Freq: Once | ORAL | Status: DC

## 2015-01-25 NOTE — Progress Notes (Signed)
No egg or soy allergy known to patient  No issues with past sedation with any surgeries  or procedures, no intubation problems  No diet pills per patient  No home 02 use per patient  Pt states she has 2-3 BM'S a week and they are hard, occ hard pebbles and never soft . Gave 2 day prep for this reason   emmi video   [email or web address]

## 2015-01-27 ENCOUNTER — Telehealth: Payer: Self-pay | Admitting: Internal Medicine

## 2015-01-27 NOTE — Telephone Encounter (Signed)
I have placed a suprep sample at the front desk for patient pick up. I have attempted to reach patient but got no answer and no voicemail. I will attempt to call back at a later time.

## 2015-01-28 NOTE — Telephone Encounter (Signed)
Left a message for patient to return my call. 

## 2015-01-28 NOTE — Telephone Encounter (Signed)
Informed patient that a Suprep sample is out at our front desk if she wants to pick it up. Pt verbalized understanding.

## 2015-02-08 ENCOUNTER — Encounter: Payer: Self-pay | Admitting: Internal Medicine

## 2015-02-08 ENCOUNTER — Ambulatory Visit (AMBULATORY_SURGERY_CENTER): Payer: BLUE CROSS/BLUE SHIELD | Admitting: Internal Medicine

## 2015-02-08 VITALS — BP 122/82 | HR 62 | Temp 97.5°F | Resp 19 | Ht 66.5 in | Wt 193.0 lb

## 2015-02-08 DIAGNOSIS — D124 Benign neoplasm of descending colon: Secondary | ICD-10-CM

## 2015-02-08 DIAGNOSIS — Z1211 Encounter for screening for malignant neoplasm of colon: Secondary | ICD-10-CM

## 2015-02-08 DIAGNOSIS — D125 Benign neoplasm of sigmoid colon: Secondary | ICD-10-CM

## 2015-02-08 DIAGNOSIS — D123 Benign neoplasm of transverse colon: Secondary | ICD-10-CM

## 2015-02-08 MED ORDER — SODIUM CHLORIDE 0.9 % IV SOLN: 500.0000 mL | INTRAVENOUS | Status: DC

## 2015-02-08 NOTE — Progress Notes (Signed)
Called to room to assist during endoscopic procedure.  Patient ID and intended procedure confirmed with present staff. Received instructions for my participation in the procedure from the performing physician.  

## 2015-02-08 NOTE — Patient Instructions (Addendum)
YOU HAD AN ENDOSCOPIC PROCEDURE TODAY AT [facility]:   Refer to the procedure report that was given to you for any specific questions about what was found during the examination.  If the procedure report does not answer your questions, please call your gastroenterologist to clarify.  If you requested that your care partner not be given the details of your procedure findings, then the procedure report has been included in a sealed envelope for you to review at your convenience later.  YOU SHOULD EXPECT: Some feelings of bloating in the abdomen. Passage of more gas than usual.  Walking can help get rid of the air that was put into your GI tract during the procedure and reduce the bloating. If you had a lower endoscopy (such as a colonoscopy or flexible sigmoidoscopy) you may notice spotting of blood in your stool or on the toilet paper. If you underwent a bowel prep for your procedure, you may not have a normal bowel movement for a few days.  Please Note:  You might notice some irritation and congestion in your nose or some drainage.  This is from the oxygen used during your procedure.  There is no need for concern and it should clear up in a day or so.  SYMPTOMS TO REPORT IMMEDIATELY:   Following lower endoscopy (colonoscopy or flexible sigmoidoscopy):  Excessive amounts of blood in the stool  Significant tenderness or worsening of abdominal pains  Swelling of the abdomen that is new, acute  Fever of 100F or higher   For urgent or emergent issues, a gastroenterologist can be reached at any hour by calling [phone].   DIET: Your first meal following the procedure should be a small meal and then it is ok to progress to your normal diet. Heavy or fried foods are harder to digest and may make you feel nauseous or bloated.  Likewise, meals heavy in dairy and vegetables can increase bloating.  Drink plenty of fluids but you should avoid alcoholic beverages for 24  hours.  ACTIVITY:  You should plan to take it easy for the rest of today and you should NOT DRIVE or use heavy machinery until tomorrow (because of the sedation medicines used during the test).    FOLLOW UP: Our staff will call the number listed on your records the next business day following your procedure to check on you and address any questions or concerns that you may have regarding the information given to you following your procedure. If we do not reach you, we will leave a message.  However, if you are feeling well and you are not experiencing any problems, there is no need to return our call.  We will assume that you have returned to your regular daily activities without incident.  If any biopsies were taken you will be contacted by phone or by letter within the next 1-3 weeks.  Please call us at [phone] if you have not heard about the biopsies in 3 weeks.    SIGNATURES/CONFIDENTIALITY: You and/or your care partner have signed paperwork which will be entered into your electronic medical record.  These signatures attest to the fact that that the information above on your After Visit Summary has been reviewed and is understood.  Full responsibility of the confidentiality of this discharge information lies with you and/or your care-partner.  Read all of the handouts given to you by your recover room nurse. Thank-you for choosing [place] for your healthcare needs.  Avoid NSAIDS  for two weeks...aleve, ibuprofen and aspirin.  Repeat colonoscopy in 3 years.

## 2015-02-08 NOTE — Op Note (Signed)
[facility]  [address] [place], [address]   COLONOSCOPY PROCEDURE REPORT  PATIENT: Megan Gallagher, Megan Gallagher  MR#: [id number] BIRTHDATE: [birth year] , 49  yrs. old GENDER: female ENDOSCOPIST: Jerene Bears, MD REFERRED [id number] Everlene Farrier, M.D. PROCEDURE DATE:  02/08/2015 PROCEDURE:   Colonoscopy, screening and Colonoscopy with snare polypectomy First Screening Colonoscopy - Avg.  risk and is 50 yrs.  old or older Yes.  Prior Negative Screening - Now for repeat screening. N/A  History of Adenoma - Now for follow-up colonoscopy & has been > or = to 3 yrs.  N/A  Polyps removed today? Yes ASA CLASS:   Class II INDICATIONS:Screening for colonic neoplasia and FH Colon or Rectal Adenocarcinoma (sister). MEDICATIONS: Monitored anesthesia care and Propofol 250 mg IV  DESCRIPTION OF PROCEDURE:   After the risks benefits and alternatives of the procedure were thoroughly explained, informed consent was obtained.  The digital rectal exam revealed no rectal mass.   The LB [id number]  endoscope was introduced through the anus and advanced to the terminal ileum which was intubated for a short distance. No adverse events experienced.   The quality of the prep was excellent.  (Suprep was used)  The instrument was then slowly withdrawn as the colon was fully examined. Estimated blood loss is zero unless otherwise noted in this procedure report.  COLON FINDINGS: A sessile polyp measuring 5 mm in size was found in the transverse colon.  A polypectomy was performed with a cold snare.  The resection was complete, the polyp tissue was completely retrieved and sent to histology.   A pedunculated polyp measuring 10 mm in size was found in the distal transverse colon.  A polypectomy was performed using snare cautery.  The resection was complete, the polyp tissue was completely retrieved and sent to histology.   A sessile polyp measuring 3 mm in size was found in the sigmoid colon.  A  polypectomy was performed with a cold snare. The resection was complete, the polyp tissue was completely retrieved and sent to histology.  Retroflexed views revealed no abnormalities. The time to cecum = 2.8 Withdrawal time = 12.1   The scope was withdrawn and the procedure completed. COMPLICATIONS: There were no immediate complications.  ENDOSCOPIC IMPRESSION: 1.   Sessile polyp was found in the transverse colon; polypectomy was performed with a cold snare 2.   Pedunculated polyp was found in the distal transverse colon; polypectomy was performed using snare cautery 3.   Sessile polyp was found in the sigmoid colon; polypectomy was performed with a cold snare  RECOMMENDATIONS: 1.  Avoid all NSAIDs for the next 2 weeks. 2.  Await pathology results 3.  Repeat Colonoscopy in 3 years. 4.  You will receive a letter within 1-2 weeks with the results of your biopsy as well as final recommendations.  Please call my office if you have not received a letter after 3 weeks.  eSigned:  Jerene Bears, MD 02/08/2015 10:00 AM cc:  the patient, Dr. Everlene Farrier

## 2015-02-08 NOTE — Progress Notes (Signed)
A/ox3, pleased with MAC, report to RN 

## 2015-02-09 ENCOUNTER — Other Ambulatory Visit: Payer: Self-pay | Admitting: Physician Assistant

## 2015-02-09 ENCOUNTER — Telehealth: Payer: Self-pay | Admitting: *Deleted

## 2015-02-09 NOTE — Telephone Encounter (Signed)
Left message that we called for f/u 

## 2015-02-11 ENCOUNTER — Encounter: Payer: Self-pay | Admitting: Internal Medicine

## 2015-02-11 NOTE — Telephone Encounter (Signed)
Megan Gallagher, you saw pt for CPE in Dec, but I don't see allergies/sinus discussed. Do you want to RF her flonase?

## 2015-02-17 ENCOUNTER — Other Ambulatory Visit: Payer: Self-pay

## 2015-02-17 DIAGNOSIS — E7800 Pure hypercholesterolemia, unspecified: Secondary | ICD-10-CM

## 2015-02-17 MED ORDER — ROSUVASTATIN CALCIUM 20 MG PO TABS: 20.0000 mg | ORAL_TABLET | Freq: Every day | ORAL | Status: AC

## 2015-03-13 ENCOUNTER — Emergency Department (HOSPITAL_COMMUNITY): Payer: BLUE CROSS/BLUE SHIELD

## 2015-03-13 ENCOUNTER — Emergency Department (HOSPITAL_COMMUNITY)
Admission: EM | Admit: 2015-03-13 | Discharge: 2015-03-13 | Disposition: A | Discharge status: Home / Self Care | Payer: BLUE CROSS/BLUE SHIELD | Attending: Emergency Medicine | Admitting: Emergency Medicine

## 2015-03-13 ENCOUNTER — Encounter (HOSPITAL_COMMUNITY): Payer: Self-pay | Admitting: Emergency Medicine

## 2015-03-13 DIAGNOSIS — Z79899 Other long term (current) drug therapy: Secondary | ICD-10-CM | POA: Diagnosis not present

## 2015-03-13 DIAGNOSIS — Y998 Other external cause status: Secondary | ICD-10-CM | POA: Insufficient documentation

## 2015-03-13 DIAGNOSIS — Y92009 Unspecified place in unspecified non-institutional (private) residence as the place of occurrence of the external cause: Secondary | ICD-10-CM | POA: Insufficient documentation

## 2015-03-13 DIAGNOSIS — X501XXA Overexertion from prolonged static or awkward postures, initial encounter: Secondary | ICD-10-CM | POA: Diagnosis not present

## 2015-03-13 DIAGNOSIS — E785 Hyperlipidemia, unspecified: Secondary | ICD-10-CM | POA: Diagnosis not present

## 2015-03-13 DIAGNOSIS — K5900 Constipation, unspecified: Secondary | ICD-10-CM | POA: Diagnosis not present

## 2015-03-13 DIAGNOSIS — S93401A Sprain of unspecified ligament of right ankle, initial encounter: Secondary | ICD-10-CM | POA: Insufficient documentation

## 2015-03-13 DIAGNOSIS — I10 Essential (primary) hypertension: Secondary | ICD-10-CM | POA: Insufficient documentation

## 2015-03-13 DIAGNOSIS — K219 Gastro-esophageal reflux disease without esophagitis: Secondary | ICD-10-CM | POA: Diagnosis not present

## 2015-03-13 DIAGNOSIS — S99911A Unspecified injury of right ankle, initial encounter: Secondary | ICD-10-CM | POA: Diagnosis present

## 2015-03-13 DIAGNOSIS — Z8739 Personal history of other diseases of the musculoskeletal system and connective tissue: Secondary | ICD-10-CM | POA: Insufficient documentation

## 2015-03-13 DIAGNOSIS — Y9339 Activity, other involving climbing, rappelling and jumping off: Secondary | ICD-10-CM | POA: Diagnosis not present

## 2015-03-13 DIAGNOSIS — Z7951 Long term (current) use of inhaled steroids: Secondary | ICD-10-CM | POA: Insufficient documentation

## 2015-03-13 MED ORDER — OXYCODONE-ACETAMINOPHEN 5-325 MG PO TABS
1.0000 | ORAL_TABLET | Freq: Once | ORAL | Status: AC
Start: 2015-03-13 — End: 2015-03-13
  Administered 2015-03-13: 1 via ORAL
  Filled 2015-03-13: qty 1

## 2015-03-13 MED ORDER — HYDROCODONE-ACETAMINOPHEN 5-325 MG PO TABS
1.0000 | ORAL_TABLET | ORAL | Status: AC | PRN
Start: 2015-03-13 — End: ?

## 2015-03-13 NOTE — ED Provider Notes (Signed)
CSN: [id number]     Arrival date & time 03/13/15  1741 History  By signing my name below, I, Megan Gallagher, attest that this documentation has been prepared under the direction and in the presence of non-physician practitioner, Bernerd Limbo, PA-C. Electronically Signed: Dora Gallagher, Scribe. 03/13/2015. 7:34 PM.     Chief Complaint  Patient presents with  . Ankle Injury    The history is provided by the patient. No language interpreter was used.     HPI Comments: Megan Gallagher is a 50 y.o. female with PMHx of HTN, HLD, and arthritis who presents to the Emergency Department complaining of sudden onset, constant right ankle pain and swelling s/p twisting her right ankle earlier this evening. She was jumping in a bouncy house for her grandson's birthday and twisted her right ankle s/p landing. She states that she felt a pulling sensation in her right ankle and felt it pop s/p landing. She came straight to the ER s/p incident. She notes that her right ankle pain is exacerbated with movement. She has taken percocet for her right ankle pain with moderate relief. She notes numbness in her right toes but denies sensation loss. She notes pain with right ankle movement; she states that her right ankle pain is worse with vertical movement. She works at [employer] and is on her feet constantly. She denies any other symptoms.   Past Medical History  Diagnosis Date  . Hypertension   . Hyperlipidemia   . Allergy     seasonal  . Anemia     with pregnancy   . Anxiety   . Arthritis     left pointer finger   . GERD (gastroesophageal reflux disease)   . Constipation    Past Surgical History  Procedure Laterality Date  . Breast surgery      fibroids x 2   . Wisdom tooth extraction     Family History  Problem Relation Age of Onset  . Alzheimer's disease Mother   . Heart disease Father   . Colon cancer Sister 51  . Colon polyps Neg Hx   . Esophageal cancer Neg Hx   . Rectal cancer Neg Hx    . Stomach cancer Neg Hx    Social History  Substance Use Topics  . Smoking status: Never Smoker   . Smokeless tobacco: Never Used  . Alcohol Use: No   OB History    No data available      Review of Systems  Musculoskeletal: Positive for joint swelling and arthralgias (right ankle).  Neurological: Positive for numbness.      Allergies  Review of patient's allergies indicates no known allergies.  Home Medications   Prior to Admission medications   Medication Sig Start Date End Date Taking? Authorizing Provider  bisacodyl (DULCOLAX) 5 MG EC tablet Take 5 mg by mouth once.    Historical Provider, MD  cetirizine (ZYRTEC) 10 MG tablet TAKE 1 TABLET BY MOUTH AT BEDTIME 01/13/15   Elby Beck, FNP  cyclobenzaprine (FLEXERIL) 10 MG tablet TAKE 1 TABLET BY MOUTH AT BEDTIME AS NEEDED FOR MUSCLE SPASMS 12/16/14   Elby Beck, FNP  fluticasone [facility]) 50 MCG/ACT nasal spray USE 2 SPRAYS IN EACH NOSTRIL AT BEDTIME 02/12/15   Elby Beck, FNP  hydrochlorothiazide (HYDRODIURIL) 25 MG tablet Take 0.5 tablets (12.5 mg total) by mouth daily. 12/10/13   Araceli Bouche, PA  HYDROcodone-acetaminophen (NORCO/VICODIN) 5-325 MG tablet Take 1 tablet by mouth every 4 (four) hours as  needed. 03/13/15   Marella Chimes, PA-C  omeprazole (PRILOSEC) 20 MG capsule TAKE 1 CAPSULE BY MOUTH DAILY 01/15/15   Elby Beck, FNP  PARoxetine (PAXIL) 20 MG tablet Take 1 tablet (20 mg total) by mouth daily. 12/16/14   Elby Beck, FNP  polyethylene glycol powder (MIRALAX) powder Take 1 Container by mouth once.    Historical Provider, MD  rosuvastatin (CRESTOR) 20 MG tablet Take 1 tablet (20 mg total) by mouth daily. 02/17/15   Elby Beck, FNP  traZODone (DESYREL) 50 MG tablet Take 0.5-1 tablets (25-50 mg total) by mouth at bedtime as needed. 12/16/14   Elby Beck, FNP    BP 152/101 mmHg  Pulse 79  Temp(Src) 97.9 F (36.6 C) (Oral)  Resp 18  SpO2 100%  LMP  06/05/2011 Physical Exam  Constitutional: She is oriented to person, place, and time. She appears well-developed and well-nourished. No distress.  HENT:  Head: Normocephalic and atraumatic.  Right Ear: External ear normal.  Left Ear: External ear normal.  Nose: Nose normal.  Eyes: Conjunctivae and EOM are normal. Right eye exhibits no discharge. Left eye exhibits no discharge. No scleral icterus.  Neck: Normal range of motion. Neck supple.  Cardiovascular: Normal rate, regular rhythm and intact distal pulses.   Pulmonary/Chest: Effort normal and breath sounds normal. No respiratory distress.  Musculoskeletal: She exhibits edema and tenderness.  Edema and TTP to right lateral ankle with decreased ROM due to pain. Distal pulses intact. Strength and sensation intact.  Neurological: She is alert and oriented to person, place, and time. She has normal strength. No sensory deficit.  Skin: Skin is warm and dry. She is not diaphoretic.  Psychiatric: She has a normal mood and affect. Her behavior is normal.  Nursing note and vitals reviewed.   ED Course  Procedures (including critical care time)  DIAGNOSTIC STUDIES: Oxygen Saturation is 100% on RA, normal by my interpretation.    COORDINATION OF CARE: 7:34 PM Will order crutches and will apply ankle ASO. Discussed treatment plan with pt at bedside and pt agreed to plan.  Labs Review Labs Reviewed - No data to display  Imaging Review Dg Ankle Complete Right  03/13/2015  CLINICAL DATA:  Right ankle pain after twisting injury in a bouncy house EXAM: RIGHT ANKLE - COMPLETE 3+ VIEW COMPARISON:  None. FINDINGS: No fracture. No subluxation. Ankle mortise is preserved. Prominent soft tissue swelling is evident. IMPRESSION: Soft tissue swelling without acute bony abnormality. Electronically Signed   By: Misty Stanley M.D.   On: 03/13/2015 18:40   I have personally reviewed and evaluated these images as part of my medical decision-making.   EKG  Interpretation None      MDM   Final diagnoses:  Right ankle sprain, initial encounter    49 year old female presents with right ankle pain, which she states started after she twisted her ankle while jumping in a bouncy house today prior to arrival. Patient is afebrile. Vital signs stable. On exam, she has edema and tenderness to palpation to her right lateral ankle with decreased range of motion due to pain. Patient is neurovascularly intact. Imaging negative for acute bony abnormality. Discussed findings with patient. Will give ankle brace and crutches. Patient to follow-up with orthopedics. Advised to rest, ice, and elevate. Will give short course of pain medication for symptom relief. Return precautions discussed. Patient verbalizes her understanding and is in agreement with plan.  BP 152/101 mmHg  Pulse 79  Temp(Src) 97.9 F (  36.6 C) (Oral)  Resp 18  SpO2 100%  LMP 06/05/2011  I personally performed the services described in this documentation, which was scribed in my presence. The recorded information has been reviewed and is accurate.       Marella Chimes, PA-C 03/13/15 1956  Tanna Furry, MD 03/22/15 1451

## 2015-03-13 NOTE — ED Notes (Signed)
Patient presents for right ankle injury, was jumping in bouncy house and twisted right ankle. Swelling noted to same, no obvious deformity, no bruising, pedal pulses intact. Rates pain 10/10.

## 2015-03-13 NOTE — Discharge Instructions (Signed)
1. Medications: vicodin for pain, usual home medications 2. Treatment: rest, drink plenty of fluids, ice, elevate, wear splint and use crutches 3. Follow Up: please followup with orthopedics for persistent symptoms for discussion of your diagnoses and further evaluation after today's visit; please return to the ER for increased pain, numbness, weakness, new or worsening symptoms   Ankle Sprain An ankle sprain is an injury to the strong, fibrous tissues (ligaments) that hold the bones of your ankle joint together.  CAUSES An ankle sprain is usually caused by a fall or by twisting your ankle. Ankle sprains most commonly occur when you step on the outer edge of your foot, and your ankle turns inward. People who participate in sports are more prone to these types of injuries.  SYMPTOMS   Pain in your ankle. The pain may be present at rest or only when you are trying to stand or walk.  Swelling.  Bruising. Bruising may develop immediately or within 1 to 2 days after your injury.  Difficulty standing or walking, particularly when turning corners or changing directions. DIAGNOSIS  Your caregiver will ask you details about your injury and perform a physical exam of your ankle to determine if you have an ankle sprain. During the physical exam, your caregiver will press on and apply pressure to specific areas of your foot and ankle. Your caregiver will try to move your ankle in certain ways. An X-ray exam may be done to be sure a bone was not broken or a ligament did not separate from one of the bones in your ankle (avulsion fracture).  TREATMENT  Certain types of braces can help stabilize your ankle. Your caregiver can make a recommendation for this. Your caregiver may recommend the use of medicine for pain. If your sprain is severe, your caregiver may refer you to a surgeon who helps to restore function to parts of your skeletal system (orthopedist) or a physical therapist. [place] ice to your injury for 1-2 days or as directed by your caregiver. Applying ice helps to reduce inflammation and pain.  Put ice in a plastic bag.  Place a towel between your skin and the bag.  Leave the ice on for 15-20 minutes at a time, every 2 hours while you are awake.  Only take over-the-counter or prescription medicines for pain, discomfort, or fever as directed by your caregiver.  Elevate your injured ankle above the level of your heart as much as possible for 2-3 days.  If your caregiver recommends crutches, use them as instructed. Gradually put weight on the affected ankle. Continue to use crutches or a cane until you can walk without feeling pain in your ankle.  If you have a plaster splint, wear the splint as directed by your caregiver. Do not rest it on anything harder than a pillow for the first 24 hours. Do not put weight on it. Do not get it wet. You may take it off to take a shower or bath.  You may have been given an elastic bandage to wear around your ankle to provide support. If the elastic bandage is too tight (you have numbness or tingling in your foot or your foot becomes cold and blue), adjust the bandage to make it comfortable.  If you have an air splint, you may blow more air into it or let air out to make it more comfortable. You may take your splint off at night and before taking a shower or bath.  Wiggle your toes in the splint several times per day to decrease swelling. SEEK MEDICAL CARE IF:   You have rapidly increasing bruising or swelling.  Your toes feel extremely cold or you lose feeling in your foot.  Your pain is not relieved with medicine. SEEK IMMEDIATE MEDICAL CARE IF:  Your toes are numb or blue.  You have severe pain that is increasing. MAKE SURE YOU:   Understand these instructions.  Will watch your condition.  Will get help right away if you are not doing well or get worse.   This information is not intended to replace advice  given to you by your health care provider. Make sure you discuss any questions you have with your health care provider.   Document Released: 12/19/2004 Document Revised: 01/09/2014 Document Reviewed: 12/31/2010 Elsevier Interactive Patient Education [employer].

## 2015-03-29 ENCOUNTER — Other Ambulatory Visit: Payer: Self-pay | Admitting: Family Medicine

## 2015-04-01 ENCOUNTER — Telehealth: Payer: Self-pay

## 2015-04-01 NOTE — Telephone Encounter (Signed)
The patient is requesting a month-long prescription for another brand of rosuvastatin (CRESTOR) 20 MG tablet.  She said she is unable to fill it for the month of April due to insurance, so she needs an alternative brand of statin for the month of April.  She uses the [facility] on [address].  CB#: [phone] (home/cell) or [phone] (work)

## 2015-04-02 MED ORDER — ATORVASTATIN CALCIUM 40 MG PO TABS: 40.0000 mg | ORAL_TABLET | Freq: Every day | ORAL | Status: DC

## 2015-04-02 NOTE — Telephone Encounter (Signed)
Pt advised.

## 2015-04-02 NOTE — Telephone Encounter (Signed)
Meds ordered this encounter  Medications  . atorvastatin (LIPITOR) 40 MG tablet    Sig: Take 1 tablet (40 mg total) by mouth daily.    Dispense:  30 tablet    Refill:  0    Order Specific Question:  Supervising Provider    Answer:  DOOLITTLE, ROBERT P [id number]

## 2015-04-18 ENCOUNTER — Other Ambulatory Visit: Payer: Self-pay | Admitting: Family Medicine

## 2015-04-29 ENCOUNTER — Other Ambulatory Visit: Payer: Self-pay | Admitting: Physician Assistant

## 2015-05-06 ENCOUNTER — Other Ambulatory Visit: Payer: Self-pay | Admitting: Emergency Medicine

## 2015-05-06 ENCOUNTER — Other Ambulatory Visit: Payer: Self-pay | Admitting: Family Medicine

## 2015-05-23 ENCOUNTER — Other Ambulatory Visit: Payer: Self-pay | Admitting: Physician Assistant

## 2015-06-02 ENCOUNTER — Other Ambulatory Visit: Payer: Self-pay | Admitting: Emergency Medicine

## 2015-06-19 ENCOUNTER — Other Ambulatory Visit: Payer: Self-pay | Admitting: Physician Assistant

## 2015-06-22 ENCOUNTER — Other Ambulatory Visit: Payer: Self-pay | Admitting: Family Medicine

## 2015-07-26 ENCOUNTER — Other Ambulatory Visit: Payer: Self-pay | Admitting: Family Medicine

## 2015-10-31 ENCOUNTER — Other Ambulatory Visit: Payer: Self-pay | Admitting: Physician Assistant

## 2015-11-06 ENCOUNTER — Other Ambulatory Visit: Payer: Self-pay | Admitting: Family Medicine

## 2015-11-09 NOTE — Telephone Encounter (Signed)
ast seen 12/2014 Last refill 05/2015 Needs new pcp

## 2015-11-26 ENCOUNTER — Ambulatory Visit: Payer: BLUE CROSS/BLUE SHIELD

## 2016-02-03 ENCOUNTER — Other Ambulatory Visit: Payer: Self-pay | Admitting: Physician Assistant

## 2016-02-10 ENCOUNTER — Other Ambulatory Visit: Payer: Self-pay | Admitting: Family Medicine

## 2016-02-11 ENCOUNTER — Other Ambulatory Visit: Payer: Self-pay | Admitting: Family Medicine

## 2016-02-11 DIAGNOSIS — G4700 Insomnia, unspecified: Secondary | ICD-10-CM

## 2016-02-17 ENCOUNTER — Other Ambulatory Visit: Payer: Self-pay | Admitting: Family Medicine

## 2016-02-17 ENCOUNTER — Other Ambulatory Visit: Payer: Self-pay | Admitting: Physician Assistant

## 2016-02-17 DIAGNOSIS — G4700 Insomnia, unspecified: Secondary | ICD-10-CM

## 2016-02-21 NOTE — Telephone Encounter (Signed)
Patient needs f/u. She has not been seen here in more than a year. In the meantime I provided a courtesy refill.

## 2016-02-22 NOTE — Telephone Encounter (Signed)
lmtcb for ov

## 2016-04-08 ENCOUNTER — Other Ambulatory Visit: Payer: Self-pay | Admitting: Physician Assistant

## 2016-04-13 ENCOUNTER — Other Ambulatory Visit: Payer: Self-pay | Admitting: Physician Assistant

## 2016-05-05 ENCOUNTER — Other Ambulatory Visit: Payer: Self-pay | Admitting: Physician Assistant

## 2016-09-07 ENCOUNTER — Other Ambulatory Visit: Payer: Self-pay | Admitting: Family Medicine

## 2016-09-07 DIAGNOSIS — M62830 Muscle spasm of back: Secondary | ICD-10-CM

## 2017-03-06 IMAGING — CR DG ANKLE COMPLETE 3+V*R*
3 series · 3 of 3 positions shown · non-contrast
Comparison: None.

CLINICAL DATA: Right ankle pain after twisting injury in a bouncy
house

EXAM:
RIGHT ANKLE - COMPLETE 3+ VIEW

[x ankle ap right]
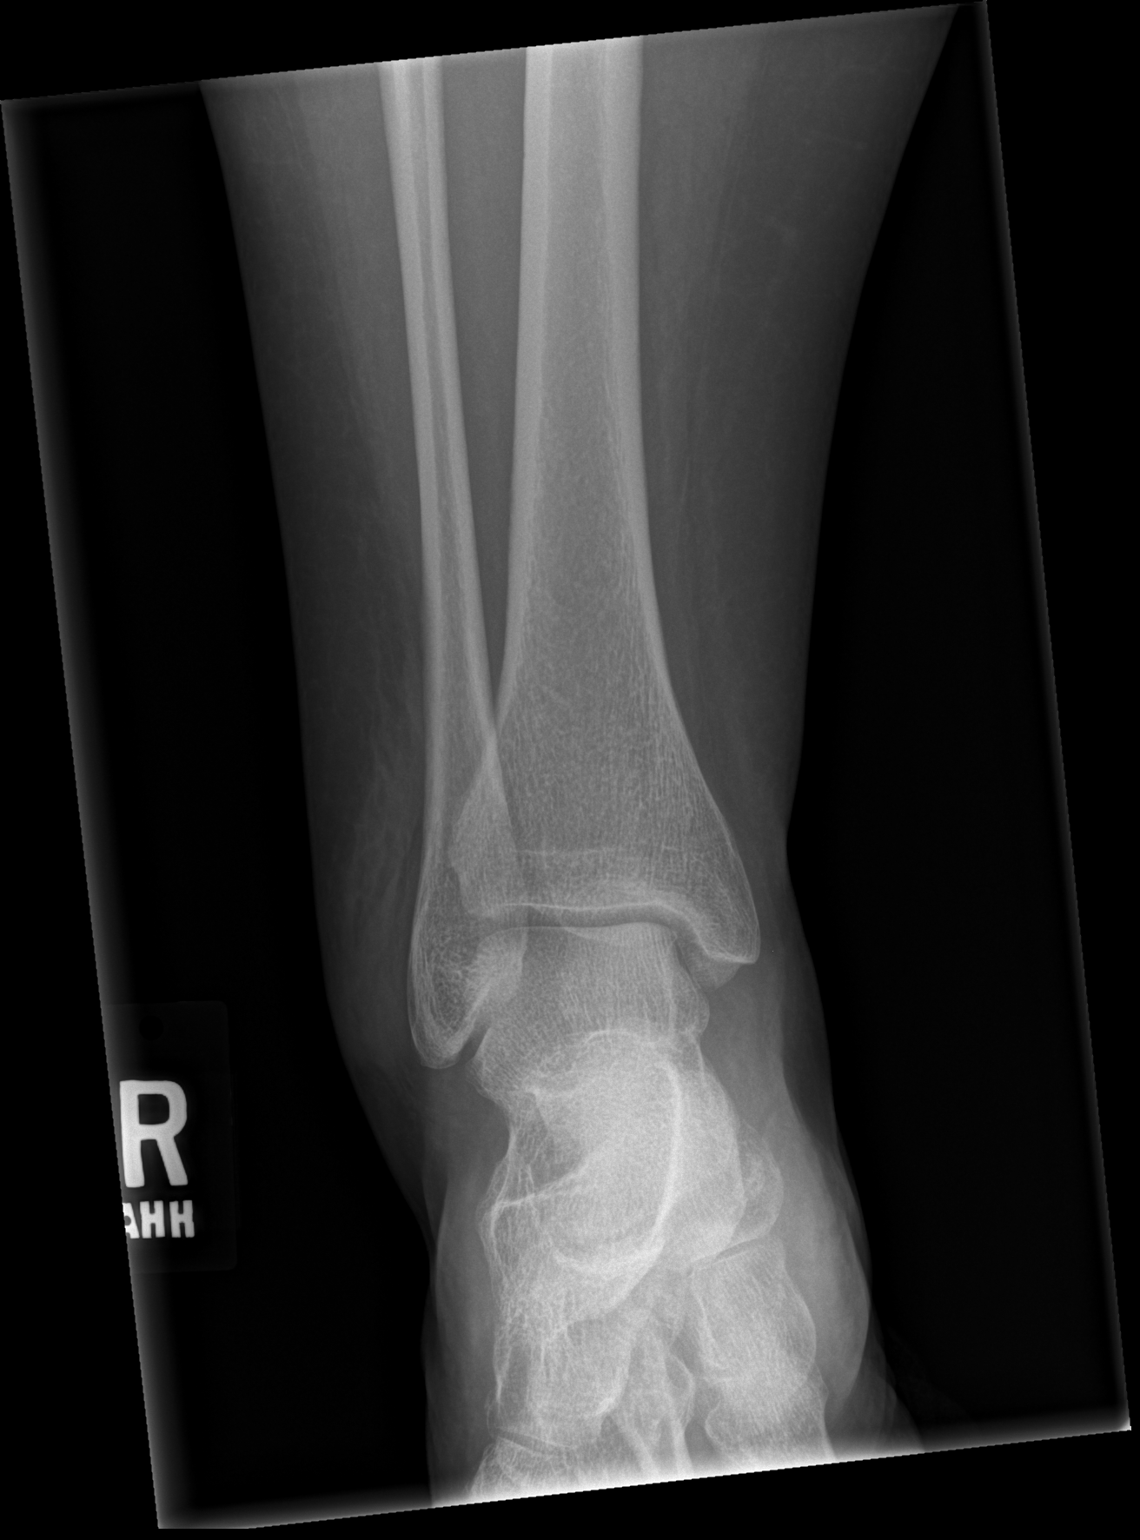

[x ankle obl right]
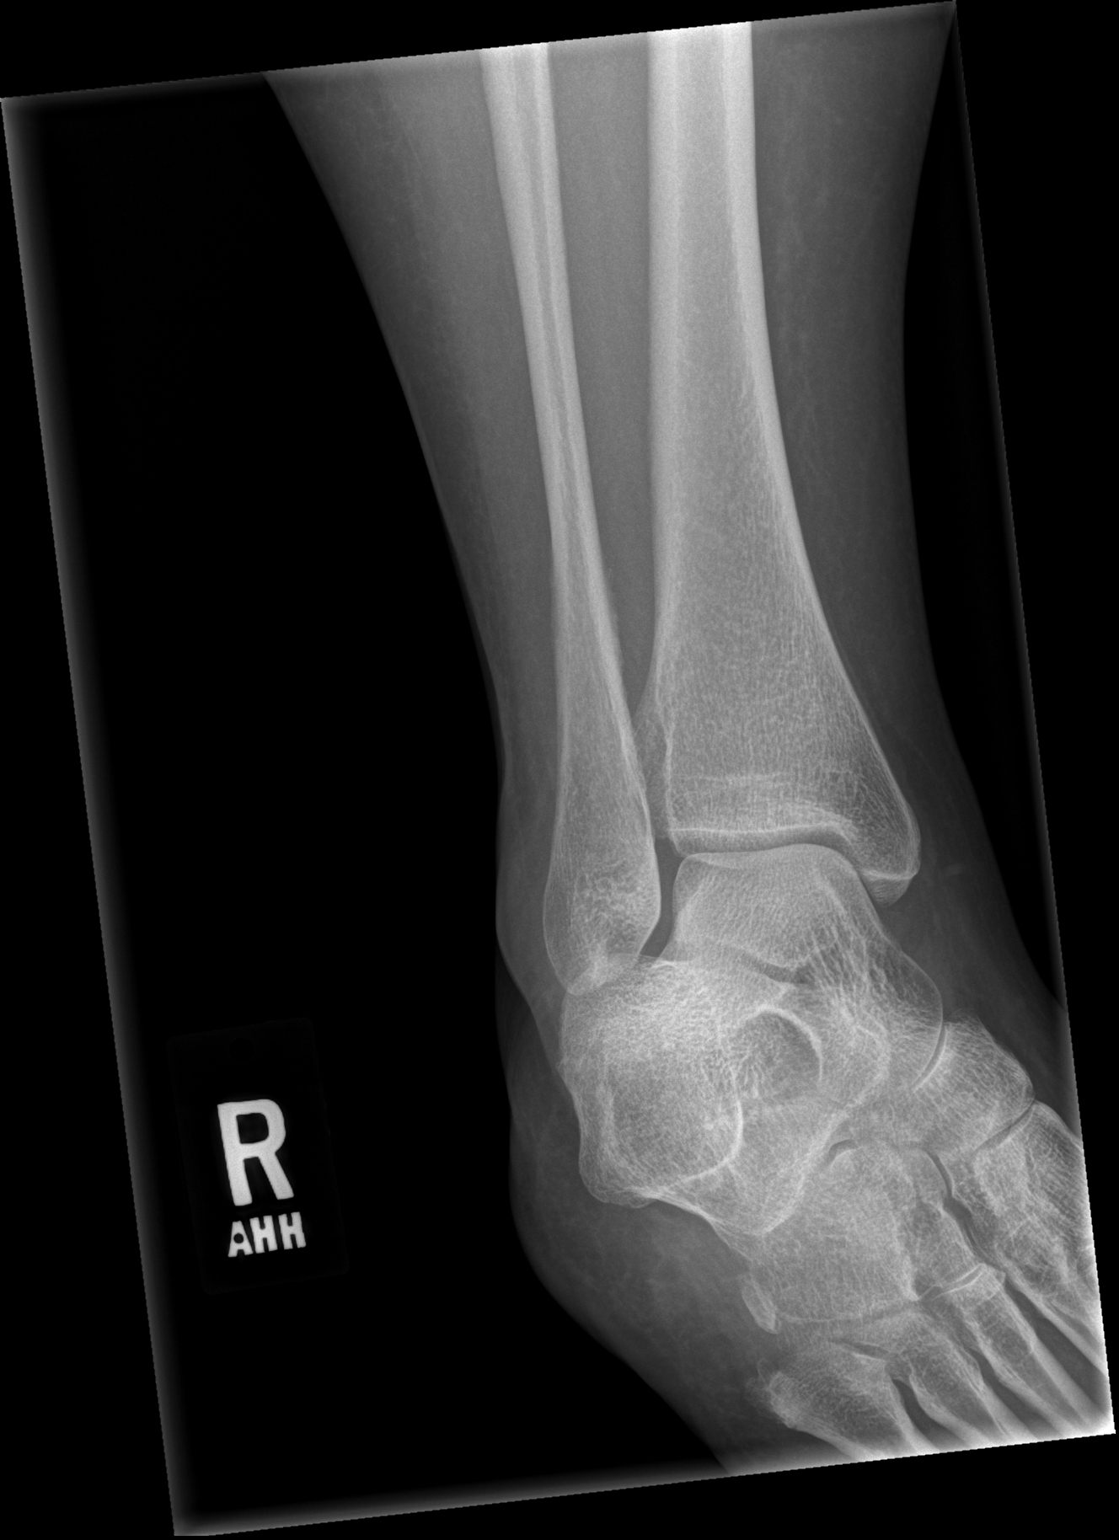

[x ankle lat right]
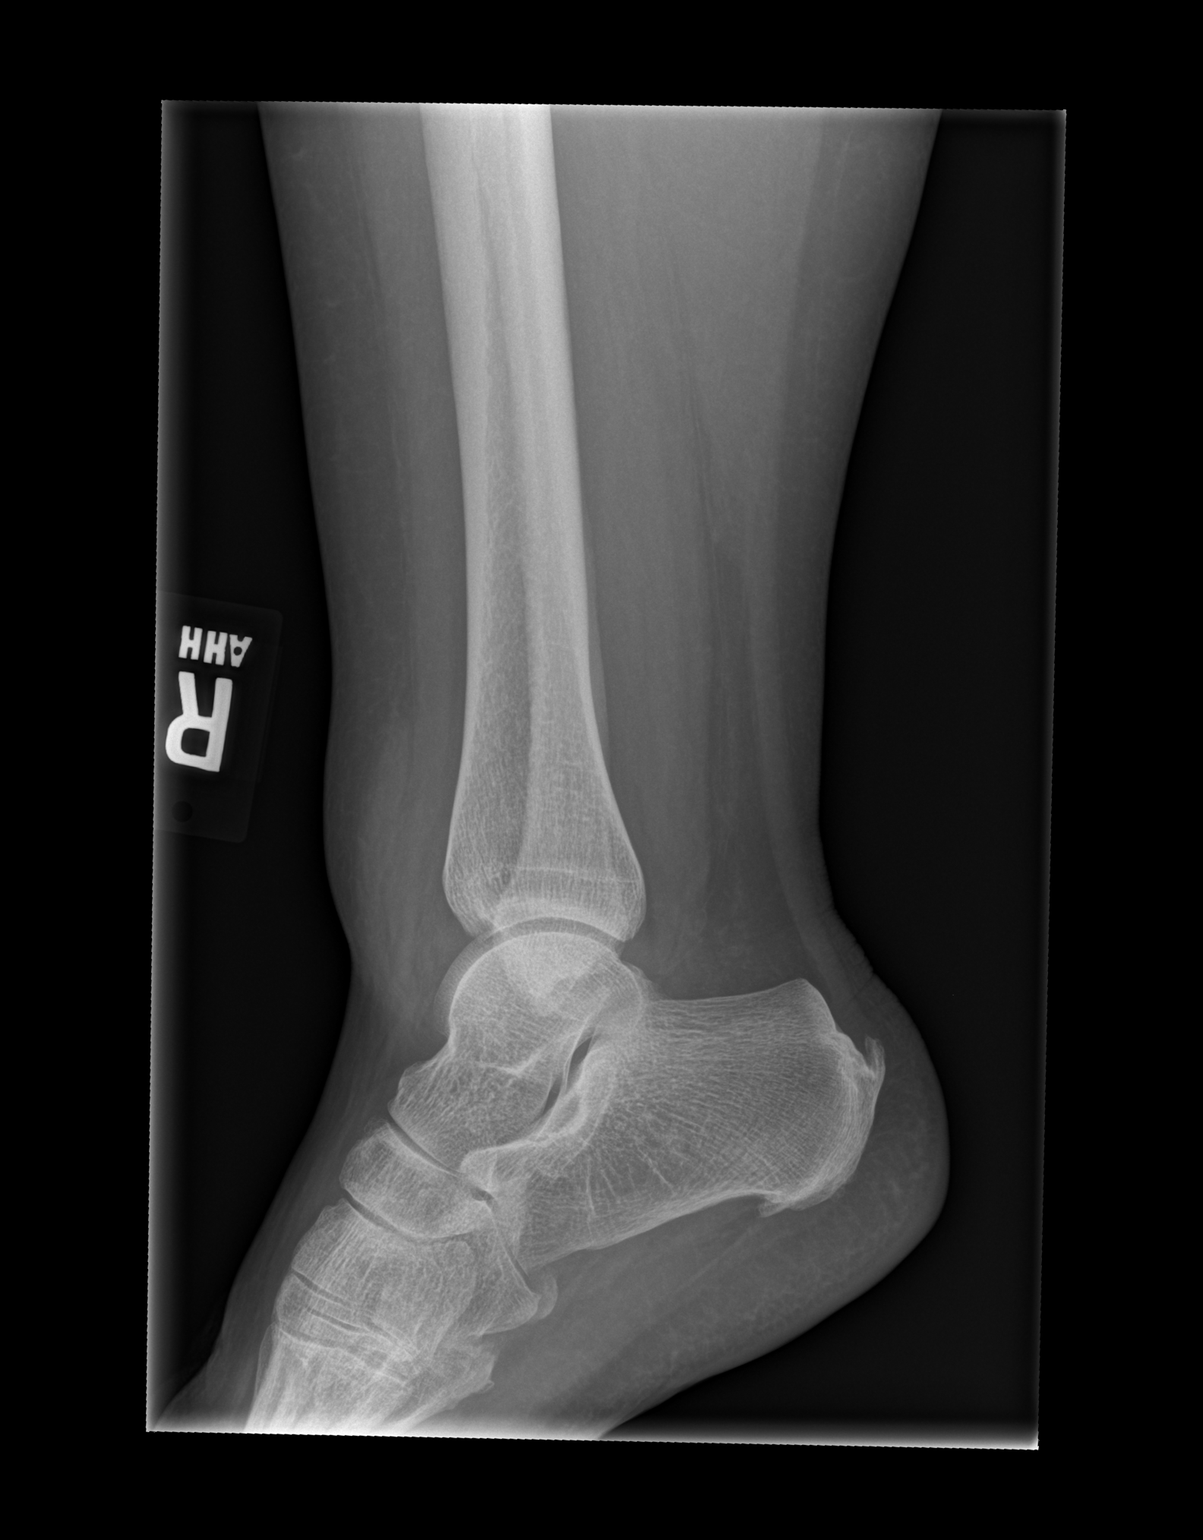

[3 of 3 positions shown; findings below may reference images not displayed]

FINDINGS: No fracture. No subluxation. Ankle mortise is preserved. Prominent
soft tissue swelling is evident.
IMPRESSION: Soft tissue swelling without acute bony abnormality.

## 2018-02-07 ENCOUNTER — Encounter: Payer: Self-pay | Admitting: *Deleted

## 2018-03-02 ENCOUNTER — Encounter: Payer: Self-pay | Admitting: Internal Medicine

## 2018-03-07 ENCOUNTER — Other Ambulatory Visit (HOSPITAL_COMMUNITY): Payer: Self-pay | Admitting: Anesthesiology

## 2018-03-07 ENCOUNTER — Ambulatory Visit (HOSPITAL_COMMUNITY)
Admission: RE | Admit: 2018-03-07 | Discharge: 2018-03-07 | Disposition: A | Discharge status: Home / Self Care | Payer: BLUE CROSS/BLUE SHIELD | Source: Ambulatory Visit | Attending: Anesthesiology | Admitting: Anesthesiology

## 2018-03-07 DIAGNOSIS — M545 Low back pain, unspecified: Secondary | ICD-10-CM
